# Patient Record
Sex: Male | Born: 1985 | Race: White | Hispanic: No | Marital: Married | Smoking: Current every day smoker
Health system: Southern US, Community
[De-identification: ages and names within clinical notes are randomized; demographics above are authoritative.]

## PROBLEM LIST (undated history)

## (undated) DIAGNOSIS — Z9889 Other specified postprocedural states: Secondary | ICD-10-CM

## (undated) DIAGNOSIS — F419 Anxiety disorder, unspecified: Secondary | ICD-10-CM

## (undated) HISTORY — PX: HERNIA REPAIR: SHX51

## (undated) HISTORY — PX: HAND SURGERY: SHX662

## (undated) HISTORY — PX: APPENDECTOMY: SHX54

## (undated) HISTORY — PX: ROOT CANAL: SHX2363

---
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
## 2004-07-06 ENCOUNTER — Emergency Department (HOSPITAL_COMMUNITY): Admission: EM | Admit: 2004-07-06 | Discharge: 2004-07-07 | Payer: Self-pay | Admitting: *Deleted

## 2005-05-10 ENCOUNTER — Emergency Department (HOSPITAL_COMMUNITY): Admission: EM | Admit: 2005-05-10 | Discharge: 2005-05-10 | Payer: Self-pay | Admitting: *Deleted

## 2006-01-01 ENCOUNTER — Emergency Department (HOSPITAL_COMMUNITY): Admission: EM | Admit: 2006-01-01 | Discharge: 2006-01-01 | Payer: Self-pay | Admitting: Emergency Medicine

## 2007-03-06 ENCOUNTER — Emergency Department (HOSPITAL_COMMUNITY): Admission: EM | Admit: 2007-03-06 | Discharge: 2007-03-06 | Payer: Self-pay | Admitting: Emergency Medicine

## 2009-02-07 ENCOUNTER — Emergency Department: Payer: Self-pay | Admitting: Emergency Medicine

## 2009-10-09 ENCOUNTER — Emergency Department: Payer: Self-pay | Admitting: Emergency Medicine

## 2009-10-23 ENCOUNTER — Emergency Department: Payer: Self-pay | Admitting: Emergency Medicine

## 2010-06-08 ENCOUNTER — Emergency Department (HOSPITAL_COMMUNITY): Admission: EM | Admit: 2010-06-08 | Discharge: 2010-06-08 | Payer: Self-pay | Admitting: Emergency Medicine

## 2010-09-23 LAB — RAPID STREP SCREEN (MED CTR MEBANE ONLY): Streptococcus, Group A Screen (Direct): NEGATIVE
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
## 2010-11-28 NOTE — H&P (Signed)
[facility]  Patient:    Ricardo Juarez, Ricardo Juarez Visit Number: [id number] MRN: [id number]          Service Type: MED Location: 3A A315 01 Attending Physician:  Corlis Leak. Dictated by:   Elpidio Anis, M.D. Admit Date:  12/15/2001   CC:         Lilyan Punt, M.D.   History and Physical  HISTORY OF PRESENT ILLNESS:  A 25 year old male admitted with acute abdominal pain with recurrent episodes of nausea and vomiting.  The patient has a history of acute onset of mid and epigastric abdominal pain about 3 p.m. on the day prior to admission.  He had nausea and vomiting all day.  He came to the emergency room where he was noted to be in severe distress and was noted to have significant epigastric tenderness with guarding and rebound.  He had multiple episodes of nausea and vomiting in the emergency room.  He had a very tender abdomen.  Labs were done and white count was 18,800 with 73 segs and 14 bands.  His potassium was decreased at 2.9.  Liver functions were normal. Amylase and lipase were not done.  Urinalysis was unremarkable.  CT of the abdomen was done, but a lot of contrast was not kept down by the patient.  CT only showed questionable pericholecystic fluid and some soft tissue prominence of the cecum.  The patient reportedly had an appendectomy as a child.  There was no free fluid and no free air in the abdomen.  The patient had an ultrasound earlier this morning which showed mild gallbladder wall thickening without fluid or stones and with a normal common bile duct.  He continues to be symptomatic and is admitted.  We will try to get a HIDA scan, but the contrast may interfere with the HIDA scan.  If the HIDA scan cannot be done, we will treat him symptomatically and probably proceed based on history and radiographic findings with diagnostic laparoscopy and cholecystectomy in the morning.  PAST HISTORY:  He has no major medical illness.  He has a  history of anxiety disorder and panic attacks for which he takes Xanax 0.25 mg t.i.d.  ALLERGIES:  He has an allergy to PENICILLIN.  SURGERY:  Right inguinal hernia repair and possible appendectomy.  The mother states that during the time of the right inguinal hernia repair a routine appendix was done; this was usually not done, but it is possible. Nevertheless, he does not have any right lower quadrant tenderness.  MEDICATIONS:  He is on no other medications.  SOCIAL HISTORY:  He is having problems with his family and he is presently not staying at home.  He smokes up to two packs of cigarettes per day, but states that he stopped three weeks ago.  He drinks occasionally and he has used marijuana.  He is very vague about the last time that he used any of these. He denies any cocaine, crack or heroin or methamphetamine use.  FAMILY HISTORY:  Positive for anxiety disorder and panic attacks, diabetes mellitus and hyperlipidemia.  PHYSICAL EXAMINATION:  GENERAL:  He is a very thin male in moderate distress with multiple episodes of retching with nausea and vomiting.  VITAL SIGNS:  Blood pressure 139/82, pulse 52, respiration 16, temperature 97.2.  HEENT:  Unremarkable.  There is no evidence of clinical jaundice.  NECK:  Supple.  There are a few nodes posteriorly and in the submental region which are nontender.  CHEST:  Reveals coarse tubular breath sounds, more prominent on the right than on the left.  HEART:  Regular rate and rhythm without murmur, gallop or rub.  ABDOMEN:  Nondistended, moderate to moderately severe epigastric and right upper quadrant tenderness, normal bowel sounds, no lower abdominal tenderness. He has good active bowel sounds.  EXTREMITIES:  Unremarkable.  NEUROLOGIC EXAM:  No motor, sensory or cerebellar deficits.  IMPRESSION: 1. Recurrent abdominal pain with nausea and vomiting with epigastric and right    upper quadrant discomfort associated with  leukocytosis.  The history    suggests gallbladder disease, although it has not been definitively    diagnosed on ultrasound or computed tomography scan. 2. History of panic disorder. 3. Psychosocial disorder.  PLAN:  The patient will be admitted, made n.p.o.  Will check his amylase and lipase, do a urine drug screen and check a lipid panel since there is a family history of hyperlipidemia.  If the HIDA cannot be done, will treat him overnight and rehydrate and replace his potassium and, if he remains symptomatic, will proceed with diagnostic laparoscopy and possible cholecystectomy in the morning.  This has been discussed with the patient. Dictated by:   Elpidio Anis, M.D. Attending Physician:  Corlis Leak DD:  12/15/01 TD:  12/15/01 Job: [id number]

## 2010-11-28 NOTE — Discharge Summary (Signed)
NAME:  Ricardo Juarez, Ricardo Juarez                        ACCOUNT NO.:  [id number]   MEDICAL RECORD NO.:  [id number]                   PATIENT TYPE:  NP   LOCATION:  A315                                 FACILITY:  [facility]   PHYSICIAN:  Jerolyn Shin C. Katrinka Blazing, M.D.                DATE OF BIRTH:  1985/09/04   DATE OF ADMISSION:  12/15/2001  DATE OF DISCHARGE:  12/21/2001                                 DISCHARGE SUMMARY   DISCHARGE DIAGNOSES:  1. Acute appendicitis with perforation and peritonitis.  2. History of panic disorder.  3. Psychosocial disorder.   PROCEDURE:  Diagnostic laparoscopy with appendectomy on December 16, 2001.   DISPOSITION:  The patient is discharged to home in stable, satisfactory  condition.   DISCHARGE MEDICATIONS:  1. Levaquin 500 mg q.d. x5 days.  2. Doxycycline 100 mg b.i.d. x5 days.  3. Darvocet-N 100 one to two every four hours as needed.   FOLLOW UP:  The patient will be seen in the office in one week.   HISTORY OF PRESENT ILLNESS:  This is a 25 year old male admitted with acute  abdominal pain with recurrent episodes of nausea and vomiting.  He had a  history of acute onset of mid and epigastric abdominal pain about 3 p.m. on  the day prior to admission.  He had nausea and vomiting all day.  He was  seen in the emergency room and noted to be in severe distress and was noted  to have epigastric tenderness with guarding and rebound.  Labs were done  that revealed a white count of 18,800 with 73 segs and 14 bands.  Potassium  was decreased to 2.9.  CT of the abdomen was done, but not a lot of contrast  was kept down, so it only showed questionable pericholecystic fluid and soft  tissue prominence of the cecum.  The patient had a history of questionable  appendectomy as a child.  He had an ultrasound which showed gallbladder wall  thickening without stones.  The patient was admitted for further diagnostic  studies.   PAST MEDICAL HISTORY:  Anxiety disorder with panic  attacks.   MEDICATIONS:  Xanax 0.25 mg t.i.d.   PAST SURGICAL HISTORY:  The mother relates he had a right inguinal hernia  repair as a child and that appendectomy was done during that time.   SOCIAL HISTORY:  He smokes two packs of cigarettes per day.  He drinks and  also uses marijuana.  He denied cocaine, crack, heroin or methamphetamine  use.   PHYSICAL EXAMINATION:  VITAL SIGNS:  Afebrile.  ABDOMEN:  Moderately to severely tender in the epigastric and right upper  quadrant.   HOSPITAL COURSE:  The patient was admitted and made NPO.  HIDA scan was done  showing ejection fraction of 17%.  It was felt that he had also some mid  epigastric and abdominal tenderness.  It was decided to proceed  with  diagnostic laparoscopy.  On December 16, 2001, diagnostic laparoscopy was done.  At the time of laparoscopy, he was found to have diffuse peritonitis with a  ruptured appendix anterior to the cecum in the mid abdomen.  He had  extensive peritoneal fluid surrounding the liver and gallbladder.  The  gallbladder was inflamed.  A laparoscopic appendectomy was done.  His  gallbladder was not removed.  The patient had some temperature elevation in  the early postoperative period.  White count initially was 18,000, but then  gradually decreased.  He felt better with each passing day.  He developed  multiple bowel movements on June 9, and after that his appetite improved.  He remained stable and was discharged home on December 21, 2001, in stable and  satisfactory condition.  Drug screen was performed and was negative for  amphetamines, cocaine, opiates, barbiturates or benzodiazepines.  The  patient was discharged to home in satisfactory condition.                                               Dirk Dress. Katrinka Blazing, M.D.    LCS/MEDQ  D:  02/19/2002  T:  02/24/2002  Job:  [id number]

## 2010-11-28 NOTE — Op Note (Signed)
   NAME:  Ricardo Juarez, Ricardo Juarez                        ACCOUNT NO.:  [id number]   MEDICAL RECORD NO.:  [id number]                   PATIENT TYPE:  NP   LOCATION:  A315                                 FACILITY:  [facility]   PHYSICIAN:  Jerolyn Shin C. Katrinka Blazing, M.D.                DATE OF BIRTH:  07-10-1986   DATE OF PROCEDURE:  12/16/2001  DATE OF DISCHARGE:  12/21/2001                                 OPERATIVE REPORT   PREOPERATIVE DIAGNOSES:  Acute abdomen, possible cholecystitis.   POSTOPERATIVE DIAGNOSES:  Acute appendicitis with perforation and  peritonitis.   PROCEDURE:  Diagnostic laparoscopy with appendectomy.   SURGEON:  Dr. Elpidio Anis.   DESCRIPTION OF PROCEDURE:  Under general anesthesia, the patient's abdomen  was prepped and draped in a sterile field. A supraumbilical incision was  made and Veress needle was inserted uneventfully. The abdomen was  insufflated with 3 liters of CO2. Using a Visiport guide, a 10 mm port was  placed. Upon placing the camera, it was noted that the patient had diffuse  purulent peritoneal fluid with a ruptured appendicitis lying anterior to the  cecum in the mid abdomen. There was extensive peritoneal fluid surrounding  the liver and gallbladder. There was a secondary gallbladder serositis due  to peritonitis. It was elected not to remove the gallbladder since this was  not the primary source of inflammation. The 5 mm port was placed under  videoscopic guidance and a 12 mm port was placed in the left lower quadrant  under videoscopic guidance. Copious irrigation was carried out without  difficulty. The appendix was grasped with a noncrushing grasper and the  mesoappendix was dissected and clipped serially with large hemoclips and  divided. The base of the appendix was dissected and was then transected  using an EndoGIA stapler using vascular type staples. The appendix was  placed in an EndoCatch device and retrieved uneventfully. Further irrigation  was  carried out. A JP drain was placed in the right gutter and brought out  through the lower midline incision. Further inspection revealed the  gallbladder to be secondarily inflamed but otherwise looked normal so it was  not addressed. The CO2 was allowed to escape from the abdomen and the ports  were removed. The incisions were closed using 0 Dexon on the fascia of the  larger incisions and staples on the skin. The patient was awakened from  anesthesia uneventfully and transferred to a bed and taken to the post  anesthesia care unit.                                               Dirk Dress. Katrinka Blazing, M.D.    LCS/MEDQ  D:  02/19/2002  T:  02/22/2002  Job:  [id number]

## 2011-09-14 ENCOUNTER — Emergency Department: Payer: Self-pay | Admitting: *Deleted

## 2012-05-23 ENCOUNTER — Encounter (HOSPITAL_COMMUNITY): Payer: Self-pay | Admitting: *Deleted

## 2012-05-23 ENCOUNTER — Emergency Department (HOSPITAL_COMMUNITY)
Admission: EM | Admit: 2012-05-23 | Discharge: 2012-05-23 | Discharge status: Against Medical Advice | Payer: Self-pay | Attending: Emergency Medicine | Admitting: Emergency Medicine

## 2012-05-23 DIAGNOSIS — F17200 Nicotine dependence, unspecified, uncomplicated: Secondary | ICD-10-CM | POA: Insufficient documentation

## 2012-05-23 DIAGNOSIS — S0003XA Contusion of scalp, initial encounter: Secondary | ICD-10-CM | POA: Insufficient documentation

## 2012-05-23 NOTE — ED Notes (Signed)
Third attempt for to call for room placement.  No response.  Pt not in waiting area.
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
## 2012-05-23 NOTE — ED Notes (Signed)
Attempted to call pt for room placement.  No response.  

## 2012-05-23 NOTE — ED Notes (Signed)
Second attempt to place pt in room.  No response.

## 2012-06-19 ENCOUNTER — Emergency Department (HOSPITAL_COMMUNITY)
Admission: EM | Admit: 2012-06-19 | Discharge: 2012-06-19 | Disposition: A | Discharge status: Home / Self Care | Payer: Self-pay | Attending: Emergency Medicine | Admitting: Emergency Medicine

## 2012-06-19 ENCOUNTER — Encounter (HOSPITAL_COMMUNITY): Payer: Self-pay | Admitting: *Deleted

## 2012-06-19 DIAGNOSIS — K0389 Other specified diseases of hard tissues of teeth: Secondary | ICD-10-CM | POA: Insufficient documentation

## 2012-06-19 DIAGNOSIS — H9209 Otalgia, unspecified ear: Secondary | ICD-10-CM | POA: Insufficient documentation

## 2012-06-19 DIAGNOSIS — K0889 Other specified disorders of teeth and supporting structures: Secondary | ICD-10-CM

## 2012-06-19 DIAGNOSIS — R51 Headache: Secondary | ICD-10-CM | POA: Insufficient documentation

## 2012-06-19 DIAGNOSIS — F17200 Nicotine dependence, unspecified, uncomplicated: Secondary | ICD-10-CM | POA: Insufficient documentation

## 2012-06-19 DIAGNOSIS — H9319 Tinnitus, unspecified ear: Secondary | ICD-10-CM | POA: Insufficient documentation

## 2012-06-19 DIAGNOSIS — Z9889 Other specified postprocedural states: Secondary | ICD-10-CM | POA: Insufficient documentation

## 2012-06-19 DIAGNOSIS — R6884 Jaw pain: Secondary | ICD-10-CM | POA: Insufficient documentation

## 2012-06-19 DIAGNOSIS — K089 Disorder of teeth and supporting structures, unspecified: Secondary | ICD-10-CM | POA: Insufficient documentation

## 2012-06-19 DIAGNOSIS — R220 Localized swelling, mass and lump, head: Secondary | ICD-10-CM | POA: Insufficient documentation

## 2012-06-19 MED ORDER — ONDANSETRON HCL 4 MG PO TABS
4.0000 mg | ORAL_TABLET | Freq: Once | ORAL | Status: AC
  Administered 2012-06-19: 4 mg via ORAL
  Filled 2012-06-19: qty 1

## 2012-06-19 MED ORDER — CIPROFLOXACIN HCL 250 MG PO TABS
500.0000 mg | ORAL_TABLET | Freq: Once | ORAL | Status: AC
  Administered 2012-06-19: 500 mg via ORAL
  Filled 2012-06-19: qty 2

## 2012-06-19 MED ORDER — CIPROFLOXACIN HCL 500 MG PO TABS: 500.0000 mg | ORAL_TABLET | Freq: Two times a day (BID) | ORAL | Status: DC

## 2012-06-19 MED ORDER — HYDROCODONE-ACETAMINOPHEN 5-325 MG PO TABS
2.0000 | ORAL_TABLET | Freq: Once | ORAL | Status: AC
  Administered 2012-06-19: 2 via ORAL
  Filled 2012-06-19: qty 2

## 2012-06-19 MED ORDER — MELOXICAM 7.5 MG PO TABS: ORAL_TABLET | ORAL | Status: DC

## 2012-06-19 MED ORDER — HYDROCODONE-ACETAMINOPHEN 5-325 MG PO TABS: ORAL_TABLET | ORAL | Status: DC

## 2012-06-19 MED ORDER — KETOROLAC TROMETHAMINE 10 MG PO TABS
10.0000 mg | ORAL_TABLET | Freq: Once | ORAL | Status: AC
  Administered 2012-06-19: 10 mg via ORAL
  Filled 2012-06-19: qty 1

## 2012-06-19 NOTE — ED Provider Notes (Signed)
History     CSN: [id number]  Arrival date & time 06/19/12  2206   None     Chief Complaint  Patient presents with  . Dental Pain  . Tinnitus    (Consider location/radiation/quality/duration/timing/severity/associated sxs/prior treatment) Patient is a 26 y.o. male presenting with tooth pain. The history is provided by the patient.  Dental PainThe primary symptoms include mouth pain and headaches. Primary symptoms do not include shortness of breath or cough. Primary symptoms comment: left ear ringing The symptoms began 3 to 5 days ago. The symptoms are worsening. The symptoms are chronic. The symptoms occur constantly.  Mouth pain began 3 - 5 days ago. Mouth pain occurs frequently. Mouth pain is worsening. Affected locations include: teeth. The mouth pain is currently at 10/10.  The headache is not associated with photophobia.  Additional symptoms include: dental sensitivity to temperature, gum swelling, jaw pain and ear pain. Additional symptoms do not include: nosebleeds. Medical issues include: smoking.    History reviewed. No pertinent past medical history.  Past Surgical History  Procedure Date  . Appendectomy   . Hernia repair     History reviewed. No pertinent family history.  History  Substance Use Topics  . Smoking status: Current Every Day Smoker -- 1.5 packs/day    Types: Cigarettes  . Smokeless tobacco: Not on file  . Alcohol Use: No      Review of Systems  Constitutional: Negative for activity change.       All ROS Neg except as noted in HPI  HENT: Positive for ear pain. Negative for nosebleeds and neck pain.   Eyes: Negative for photophobia and discharge.  Respiratory: Negative for cough, shortness of breath and wheezing.   Cardiovascular: Negative for chest pain and palpitations.  Gastrointestinal: Negative for abdominal pain and blood in stool.  Genitourinary: Negative for dysuria, frequency and hematuria.  Musculoskeletal: Negative for back pain and  arthralgias.  Skin: Negative.   Neurological: Positive for headaches. Negative for dizziness, seizures and speech difficulty.  Psychiatric/Behavioral: Negative for hallucinations and confusion.    Allergies  Amoxicillin and Penicillins  Home Medications   Current Outpatient Rx  Name  Route  Sig  Dispense  Refill  . IBUPROFEN 200 MG PO TABS   Oral   Take 800 mg by mouth every 6 (six) hours as needed. pain           BP 139/72  Pulse 76  Temp 98.1 F (36.7 C) (Axillary)  Resp 18  Ht 5\' 9"  (1.753 m)  Wt 150 lb (68.04 kg)  BMI 22.15 kg/m2  SpO2 99%  Physical Exam  Nursing note and vitals reviewed. Constitutional: He is oriented to person, place, and time. He appears well-developed and well-nourished.  Non-toxic appearance.  HENT:  Head: Normocephalic.  Right Ear: Tympanic membrane and external ear normal.  Left Ear: Tympanic membrane and external ear normal.       Multiple severely decayed teeth. The left upper canine has a deep cavity present with swelling around the tooth. No visible abscess. The airway is patent. No swelling under the tongue.  Eyes: EOM and lids are normal. Pupils are equal, round, and reactive to light.  Neck: Normal range of motion. Neck supple. Carotid bruit is not present.  Cardiovascular: Normal rate, regular rhythm, normal heart sounds, intact distal pulses and normal pulses.   No murmur heard. Pulmonary/Chest: Breath sounds normal. No respiratory distress.  Abdominal: Soft. Bowel sounds are normal. There is no tenderness. There is no  guarding.  Musculoskeletal: Normal range of motion.  Lymphadenopathy:       Head (right side): No submandibular adenopathy present.       Head (left side): No submandibular adenopathy present.    He has no cervical adenopathy.  Neurological: He is alert and oriented to person, place, and time. He has normal strength. No cranial nerve deficit or sensory deficit.  Skin: Skin is warm and dry.  Psychiatric: He has a  normal mood and affect. His speech is normal.    ED Course  Procedures (including critical care time)  Labs Reviewed - No data to display No results found. Pulse Ox 97% on room air. WNL by my interpretation.  No diagnosis found.    MDM  I have reviewed nursing notes, vital signs, and all appropriate lab and imaging results for this patient. Pt has hx of dental problems. The left upper canine area has become more severe in the last 3 to 4 days. Pt has tried OTC meds, but these do not help. No fever, but c/o sensation of ringing in the left ear. Plan for RX for cipro, mobic, and norco given to pt. Pt to see a dentist as soon as possible.       Kathie Dike, PA 06/19/12 2314  Kathie Dike, PA 06/19/12 [phone]

## 2012-06-19 NOTE — ED Notes (Signed)
Pt with pain to left upper tooth pain with left ear ringing off and on, states cold water makes it feel better

## 2012-06-19 NOTE — ED Notes (Signed)
Discharge instructions reviewed with pt, questions answered. Pt verbalized understanding.  

## 2012-06-21 NOTE — ED Provider Notes (Signed)
Medical screening examination/treatment/procedure(s) were performed by non-physician practitioner and as supervising physician I was immediately available for consultation/collaboration.   Shelda Jakes, MD 06/21/12 [phone]

## 2012-07-23 ENCOUNTER — Encounter (HOSPITAL_COMMUNITY): Payer: Self-pay

## 2012-07-23 ENCOUNTER — Emergency Department (HOSPITAL_COMMUNITY)
Admission: EM | Admit: 2012-07-23 | Discharge: 2012-07-23 | Disposition: A | Discharge status: Home / Self Care | Payer: Worker's Compensation | Attending: Emergency Medicine | Admitting: Emergency Medicine

## 2012-07-23 ENCOUNTER — Emergency Department (HOSPITAL_COMMUNITY): Payer: Worker's Compensation

## 2012-07-23 DIAGNOSIS — S86009A Unspecified injury of unspecified Achilles tendon, initial encounter: Secondary | ICD-10-CM

## 2012-07-23 DIAGNOSIS — F17200 Nicotine dependence, unspecified, uncomplicated: Secondary | ICD-10-CM | POA: Insufficient documentation

## 2012-07-23 DIAGNOSIS — Y990 Civilian activity done for income or pay: Secondary | ICD-10-CM | POA: Insufficient documentation

## 2012-07-23 DIAGNOSIS — R209 Unspecified disturbances of skin sensation: Secondary | ICD-10-CM | POA: Insufficient documentation

## 2012-07-23 DIAGNOSIS — Y9389 Activity, other specified: Secondary | ICD-10-CM | POA: Insufficient documentation

## 2012-07-23 DIAGNOSIS — S93499A Sprain of other ligament of unspecified ankle, initial encounter: Secondary | ICD-10-CM | POA: Insufficient documentation

## 2012-07-23 DIAGNOSIS — IMO0002 Reserved for concepts with insufficient information to code with codable children: Secondary | ICD-10-CM | POA: Insufficient documentation

## 2012-07-23 DIAGNOSIS — Y9289 Other specified places as the place of occurrence of the external cause: Secondary | ICD-10-CM | POA: Insufficient documentation

## 2012-07-23 MED ORDER — IBUPROFEN 600 MG PO TABS: 600.0000 mg | ORAL_TABLET | Freq: Four times a day (QID) | ORAL | Status: AC | PRN

## 2012-07-23 MED ORDER — IBUPROFEN 800 MG PO TABS
800.0000 mg | ORAL_TABLET | Freq: Once | ORAL | Status: AC
  Administered 2012-07-23: 800 mg via ORAL
  Filled 2012-07-23: qty 1

## 2012-07-23 NOTE — ED Provider Notes (Signed)
Medical screening examination/treatment/procedure(s) were performed by non-physician practitioner and as supervising physician I was immediately available for consultation/collaboration.  Donnetta Hutching, MD 07/23/12 1118

## 2012-07-23 NOTE — ED Provider Notes (Signed)
History     CSN: [id number]  Arrival date & time 07/23/12  5784   First MD Initiated Contact with Patient 07/23/12 [phone]      Chief Complaint  Patient presents with  . Leg Pain    (Consider location/radiation/quality/duration/timing/severity/associated sxs/prior treatment) HPI Comments: Ricardo Juarez presents with pain and swelling to his left posterior lower leg after being struck by a heavy hose filled reel at work just prior to arrival.  He reports swelling and numbness and tingling at the site of the injury.  He is ambulatory and can flex and extend his ankle although he has increased pain in his calf with this movement.  He denies weakness distal to the injury site.  He has had no treatments for this injury prior to arrival.   The history is provided by the patient.    History reviewed. No pertinent past medical history.  Past Surgical History  Procedure Date  . Appendectomy   . Hernia repair     No family history on file.  History  Substance Use Topics  . Smoking status: Current Every Day Smoker -- 1.5 packs/day    Types: Cigarettes  . Smokeless tobacco: Not on file  . Alcohol Use: No      Review of Systems  Musculoskeletal: Positive for arthralgias. Negative for joint swelling.  Skin: Negative for wound.  Neurological: Negative for weakness and numbness.    Allergies  Amoxicillin and Penicillins  Home Medications   Current Outpatient Rx  Name  Route  Sig  Dispense  Refill  . HYDROCODONE-ACETAMINOPHEN 5-325 MG PO TABS      1 or 2 po q4h prn pain   20 tablet   0   . IBUPROFEN 200 MG PO TABS   Oral   Take 800 mg by mouth every 6 (six) hours as needed. pain         . IBUPROFEN 600 MG PO TABS   Oral   Take 1 tablet (600 mg total) by mouth every 6 (six) hours as needed for pain.   20 tablet   0     BP 134/68  Pulse 64  Temp 98.4 F (36.9 C) (Oral)  Resp 18  Ht 5\' 9"  (1.753 m)  Wt 150 lb (68.04 kg)  BMI 22.15 kg/m2  SpO2  98%  Physical Exam  Constitutional: He appears well-developed and well-nourished.  HENT:  Head: Atraumatic.  Neck: Normal range of motion.  Cardiovascular:       Pulses equal bilaterally  Musculoskeletal: He exhibits edema and tenderness.       Left lower leg: He exhibits tenderness and swelling. He exhibits no deformity.       Tender to palpation with slight erythema and edema proximal Achilles below level of gastroc insertion.  There is no palpable disruption of the Achilles tendon.  Patient can flex and extend his ankle with mild discomfort.  Negative Thompson test.  Dorsalis pedis pulse intact, posterior tibial pulses intact.  Less than 3 second cap refill in toes.  Calf is soft and nontender.  Neurological: He is alert. He has normal strength. He displays normal reflexes. No sensory deficit.       Equal strength  Skin: Skin is warm and dry.  Psychiatric: He has a normal mood and affect.    ED Course  Procedures (including critical care time)  Labs Reviewed - No data to display Dg Tibia/fibula Left  07/23/2012  *RADIOLOGY REPORT*  Clinical Data: Pain and swelling in  the calf.  Trauma.  LEFT TIBIA AND FIBULA - 2 VIEW  Comparison: None.  Findings: Tibia and fibula appear within normal limits.  No fracture.  Prominence of the dorsal proximal leg likely represents prominent gastrocnemius.  Soft tissue swelling is also a consideration.  IMPRESSION: No acute osseous abnormality.   Original Report Authenticated By: Andreas Newport, M.D.      1. Injury of Achilles tendon       MDM  X-rays reviewed, suspect contusion with possible small hematoma surrounding Achilles tendon without tear given patient has continued range of motion and negative Thompson test.  He was encouraged ice and elevation for the next 48 hours, ibuprofen, Ace wrap for mild compression.  Recheck by PCP if not improved over the next 4-5 days.        Burgess Amor, PA 07/23/12 1028  Burgess Amor, [place] 07/23/12 1029

## 2012-07-23 NOTE — ED Notes (Signed)
Pt reports was at work and was pulling a reel, pt says he slipped and the reel hit the back of his leg.  Pt says lower leg is numb and tingly.

## 2013-07-03 ENCOUNTER — Emergency Department (HOSPITAL_COMMUNITY)
Admission: EM | Admit: 2013-07-03 | Discharge: 2013-07-03 | Disposition: A | Discharge status: Home / Self Care | Payer: Self-pay | Attending: Emergency Medicine | Admitting: Emergency Medicine

## 2013-07-03 ENCOUNTER — Emergency Department (HOSPITAL_COMMUNITY): Payer: Self-pay

## 2013-07-03 ENCOUNTER — Encounter (HOSPITAL_COMMUNITY): Payer: Self-pay | Admitting: Emergency Medicine

## 2013-07-03 DIAGNOSIS — R42 Dizziness and giddiness: Secondary | ICD-10-CM | POA: Insufficient documentation

## 2013-07-03 DIAGNOSIS — Z880 Allergy status to penicillin: Secondary | ICD-10-CM | POA: Insufficient documentation

## 2013-07-03 DIAGNOSIS — R509 Fever, unspecified: Secondary | ICD-10-CM | POA: Insufficient documentation

## 2013-07-03 DIAGNOSIS — J069 Acute upper respiratory infection, unspecified: Secondary | ICD-10-CM | POA: Insufficient documentation

## 2013-07-03 DIAGNOSIS — F17200 Nicotine dependence, unspecified, uncomplicated: Secondary | ICD-10-CM | POA: Insufficient documentation

## 2013-07-03 MED ORDER — PROMETHAZINE HCL 25 MG PO TABS: 25.0000 mg | ORAL_TABLET | Freq: Four times a day (QID) | ORAL | Status: DC | PRN

## 2013-07-03 MED ORDER — HYDROCODONE-HOMATROPINE 5-1.5 MG/5ML PO SYRP: 5.0000 mL | ORAL_SOLUTION | Freq: Four times a day (QID) | ORAL | Status: DC | PRN

## 2013-07-03 MED ORDER — IBUPROFEN 800 MG PO TABS: 800.0000 mg | ORAL_TABLET | Freq: Three times a day (TID) | ORAL | Status: DC

## 2013-07-03 NOTE — ED Provider Notes (Signed)
CSN: [id number]     Arrival date & time 07/03/13  1846 History   First MD Initiated Contact with Patient 07/03/13 1850     Chief Complaint  Patient presents with  . Fever   (Consider location/radiation/quality/duration/timing/severity/associated sxs/prior Treatment) HPI Comments: Patient presents to the ER for evaluation of fever with cough and chest congestion. Patient reports the symptoms began this afternoon. He went home after work and lay down, when he tried to get up he started feeling more lightheaded. He has had nausea but no vomiting or diarrhea. Patient felt slightly short of breath earlier, this is improved. No abdominal pain.  Patient is a 27 y.o. male presenting with fever.  Fever Associated symptoms: cough     History reviewed. No pertinent past medical history. Past Surgical History  Procedure Laterality Date  . Appendectomy    . Hernia repair     No family history on file. History  Substance Use Topics  . Smoking status: Current Every Day Smoker -- 1.50 packs/day    Types: Cigarettes  . Smokeless tobacco: Not on file  . Alcohol Use: No    Review of Systems  Constitutional: Positive for fever.  Respiratory: Positive for cough.   Neurological: Positive for dizziness.  All other systems reviewed and are negative.    Allergies  Amoxicillin and Penicillins  Home Medications   Current Outpatient Rx  Name  Route  Sig  Dispense  Refill  . HYDROcodone-acetaminophen (NORCO) 5-325 MG per tablet      1 or 2 po q4h prn pain   20 tablet   0   . ibuprofen (ADVIL,MOTRIN) 200 MG tablet   Oral   Take 800 mg by mouth every 6 (six) hours as needed. pain          BP 136/80  Pulse 97  Temp(Src) 102.3 F (39.1 C) (Oral)  Resp 24  Ht 5\' 8"  (1.727 m)  Wt 150 lb (68.04 kg)  BMI 22.81 kg/m2  SpO2 97% Physical Exam  Constitutional: He is oriented to person, place, and time. He appears well-developed and well-nourished. No distress.  HENT:  Head:  Normocephalic and atraumatic.  Right Ear: Hearing normal.  Left Ear: Hearing normal.  Nose: Nose normal.  Mouth/Throat: Oropharynx is clear and moist and mucous membranes are normal.  Eyes: Conjunctivae and EOM are normal. Pupils are equal, round, and reactive to light.  Neck: Normal range of motion. Neck supple.  Cardiovascular: Regular rhythm, S1 normal and S2 normal.  Exam reveals no gallop and no friction rub.   No murmur heard. Pulmonary/Chest: Effort normal and breath sounds normal. No respiratory distress. He exhibits no tenderness.  Abdominal: Soft. Normal appearance and bowel sounds are normal. There is no hepatosplenomegaly. There is no tenderness. There is no rebound, no guarding, no tenderness at McBurney's point and negative Murphy's sign. No hernia.  Musculoskeletal: Normal range of motion.  Neurological: He is alert and oriented to person, place, and time. He has normal strength. No cranial nerve deficit or sensory deficit. Coordination normal. GCS eye subscore is 4. GCS verbal subscore is 5. GCS motor subscore is 6.  Skin: Skin is warm, dry and intact. No rash noted. No cyanosis.  Psychiatric: He has a normal mood and affect. His speech is normal and behavior is normal. Thought content normal.    ED Course  Procedures (including critical care time) Labs Review Labs Reviewed - No data to display Imaging Review Dg Chest 2 View  07/03/2013   CLINICAL  DATA:  Fever and cough.  EXAM: CHEST  2 VIEW  COMPARISON:  None.  FINDINGS: The lungs are clear without focal infiltrate, edema, pneumothorax or pleural effusion. The cardiopericardial silhouette is within normal limits for size. Imaged bony structures of the thorax are intact.  IMPRESSION: No acute cardiopulmonary findings.   Electronically Signed   By: Kennith Center M.D.   On: 07/03/2013 19:28    EKG Interpretation   None       MDM  Diagnosis: Fever, upper respiratory infection  Patient presents to the ER for evaluation  of fever, chills, upper respiratory infection symptoms including cough. He had dizziness and felt lightheaded, but neurologic examination was unremarkable. No focal abnormalities. Lungs are clear and chest x-ray is clear. Symptoms consistent with viral etiology, possible flu.    Gilda Crease, MD 07/03/13 [phone]

## 2013-07-03 NOTE — ED Notes (Signed)
Pt c/o fever, cough, nausea, chest pain with coughing, and feeling light headed.

## 2013-07-03 NOTE — ED Notes (Signed)
Patient given discharge instruction, verbalized understand. Patient ambulatory out of the department.  

## 2014-03-16 ENCOUNTER — Emergency Department (HOSPITAL_COMMUNITY)
Admission: EM | Admit: 2014-03-16 | Discharge: 2014-03-17 | Disposition: A | Discharge status: Home / Self Care | Payer: Self-pay | Attending: Emergency Medicine | Admitting: Emergency Medicine

## 2014-03-16 ENCOUNTER — Encounter (HOSPITAL_COMMUNITY): Payer: Self-pay | Admitting: Emergency Medicine

## 2014-03-16 DIAGNOSIS — F17200 Nicotine dependence, unspecified, uncomplicated: Secondary | ICD-10-CM | POA: Insufficient documentation

## 2014-03-16 DIAGNOSIS — R42 Dizziness and giddiness: Secondary | ICD-10-CM | POA: Insufficient documentation

## 2014-03-16 DIAGNOSIS — Z791 Long term (current) use of non-steroidal anti-inflammatories (NSAID): Secondary | ICD-10-CM | POA: Insufficient documentation

## 2014-03-16 DIAGNOSIS — Z880 Allergy status to penicillin: Secondary | ICD-10-CM | POA: Insufficient documentation

## 2014-03-16 MED ORDER — SODIUM CHLORIDE 0.9 % IV SOLN
1000.0000 mL | Freq: Once | INTRAVENOUS | Status: AC
  Administered 2014-03-17: 1000 mL via INTRAVENOUS

## 2014-03-16 MED ORDER — ONDANSETRON HCL 4 MG/2ML IJ SOLN
4.0000 mg | Freq: Once | INTRAMUSCULAR | Status: AC
  Administered 2014-03-17: 4 mg via INTRAVENOUS
  Filled 2014-03-16: qty 2

## 2014-03-16 MED ORDER — SODIUM CHLORIDE 0.9 % IV SOLN: 1000.0000 mL | INTRAVENOUS | Status: DC

## 2014-03-16 NOTE — ED Notes (Signed)
Dr. Glick at bedside.  

## 2014-03-16 NOTE — ED Provider Notes (Signed)
CSN: [id number]     Arrival date & time 03/16/14  2249 History   First MD Initiated Contact with Patient 03/16/14 2354     Chief Complaint  Patient presents with  . Dizziness     (Consider location/radiation/quality/duration/timing/severity/associated sxs/prior Treatment) Patient is a 28 y.o. male presenting with dizziness. The history is provided by the patient.  Dizziness Starting 2 days ago, patient is noted to lightheadedness when he gets home. He has been sweaty hands has had some times of disorientation. There's been nausea but no vomiting. He has had a cough productive of some clear to yellow sputum. He states he feels fine during the day even though he is working out in the sun. He has not been drinking as much as he normally would be that he states he's been urinating normal amounts. Denies headache. I has not checked his temperature but his wife states that he did feel hot and he has been very sweaty at home. He denies arthralgias or myalgias. He denies vertigo.  History reviewed. No pertinent past medical history. Past Surgical History  Procedure Laterality Date  . Appendectomy    . Hernia repair     History reviewed. No pertinent family history. History  Substance Use Topics  . Smoking status: Current Every Day Smoker -- 1.50 packs/day    Types: Cigarettes  . Smokeless tobacco: Not on file  . Alcohol Use: No    Review of Systems  Neurological: Positive for dizziness.  All other systems reviewed and are negative.     Allergies  Amoxicillin and Penicillins  Home Medications   Prior to Admission medications   Medication Sig Start Date End Date Taking? Authorizing Provider  HYDROcodone-acetaminophen [facility]) 5-325 MG per tablet 1 or 2 po q4h prn pain 06/19/12   Kathie Dike, PA-C  HYDROcodone-homatropine [facility]) 5-1.5 MG/5ML syrup Take 5 mLs by mouth every 6 (six) hours as needed for cough. 07/03/13   Gilda Crease, MD  ibuprofen (ADVIL,MOTRIN) 200 MG  tablet Take 800 mg by mouth every 6 (six) hours as needed. pain    Historical Provider, MD  ibuprofen (ADVIL,MOTRIN) 800 MG tablet Take 1 tablet (800 mg total) by mouth 3 (three) times daily. 07/03/13   Gilda Crease, MD  promethazine (PHENERGAN) 25 MG tablet Take 1 tablet (25 mg total) by mouth every 6 (six) hours as needed for nausea or vomiting. 07/03/13   Gilda Crease, MD   BP 117/79  Pulse 53  Temp(Src) 98.2 F (36.8 C) (Oral)  Resp 16  Ht  (1.727 m)  Wt 150 lb (68.04 kg)  BMI 22.81 kg/m2  SpO2 98% Physical Exam  Nursing note and vitals reviewed.  28 year old male, resting comfortably and in no acute distress. Vital signs are significant for bradycardia. Oxygen saturation is 98%, which is normal. Head is normocephalic and atraumatic. PERRLA, EOMI. Oropharynx is clear. Neck is nontender and supple without adenopathy or JVD. There are no carotid bruits. Back is nontender and there is no CVA tenderness. Lungs are clear without rales, wheezes, or rhonchi. Chest is nontender. Heart has regular rate and rhythm without murmur. Abdomen is soft, flat, nontender without masses or hepatosplenomegaly and peristalsis is normoactive. Extremities have no cyanosis or edema, full range of motion is present. Skin is warm and dry without rash. Neurologic: Mental status is normal, cranial nerves are intact, there are no motor or sensory deficits. There is no dizziness elicited with head movement.  ED Course  Procedures (  including critical care time) Labs Review Results for orders placed during the hospital encounter of 03/16/14  COMPREHENSIVE METABOLIC PANEL      Result Value Ref Range   Sodium 139  137 - 147 mEq/L   Potassium 3.4 (*) 3.7 - 5.3 mEq/L   Chloride 100  96 - 112 mEq/L   CO2 26  19 - 32 mEq/L   Glucose, Bld 85  70 - 99 mg/dL   BUN 16  6 - 23 mg/dL   Creatinine, Ser 1.61  0.50 - 1.35 mg/dL   Calcium 9.0  8.4 - 09.6 mg/dL   Total Protein 6.7  6.0 - 8.3  g/dL   Albumin 4.0  3.5 - 5.2 g/dL   AST 19  0 - 37 U/L   ALT 10  0 - 53 U/L   Alkaline Phosphatase 62  39 - 117 U/L   Total Bilirubin 0.4  0.3 - 1.2 mg/dL   GFR calc non Af Amer 87 (*) >90 mL/min   GFR calc Af Amer >90  >90 mL/min   Anion gap 13  5 - 15  CBC WITH DIFFERENTIAL      Result Value Ref Range   WBC 8.7  4.0 - 10.5 K/uL   RBC 5.27  4.22 - 5.81 MIL/uL   Hemoglobin 16.7  13.0 - 17.0 g/dL   HCT 04.5  40.9 - 81.1 %   MCV 87.3  78.0 - 100.0 fL   MCH 31.7  26.0 - 34.0 pg   MCHC 36.3 (*) 30.0 - 36.0 g/dL   RDW 91.4  78.2 - 95.6 %   Platelets 229  150 - 400 K/uL   Neutrophils Relative % 46  43 - 77 %   Neutro Abs 4.1  1.7 - 7.7 K/uL   Lymphocytes Relative 40  12 - 46 %   Lymphs Abs 3.4  0.7 - 4.0 K/uL   Monocytes Relative 9  3 - 12 %   Monocytes Absolute 0.8  0.1 - 1.0 K/uL   Eosinophils Relative 4  0 - 5 %   Eosinophils Absolute 0.3  0.0 - 0.7 K/uL   Basophils Relative 1  0 - 1 %   Basophils Absolute 0.1  0.0 - 0.1 K/uL  URINALYSIS, ROUTINE W REFLEX MICROSCOPIC      Result Value Ref Range   Color, Urine YELLOW  YELLOW   APPearance CLEAR  CLEAR   Specific Gravity, Urine 1.020  1.005 - 1.030   pH 6.0  5.0 - 8.0   Glucose, UA NEGATIVE  NEGATIVE mg/dL   Hgb urine dipstick NEGATIVE  NEGATIVE   Bilirubin Urine NEGATIVE  NEGATIVE   Ketones, ur NEGATIVE  NEGATIVE mg/dL   Protein, ur NEGATIVE  NEGATIVE mg/dL   Urobilinogen, UA 0.2  0.0 - 1.0 mg/dL   Nitrite NEGATIVE  NEGATIVE   Leukocytes, UA NEGATIVE  NEGATIVE   Imaging Review Dg Chest 2 View  03/17/2014   CLINICAL DATA:  Dizziness and weakness for 2 weeks.  EXAM: CHEST  2 VIEW  COMPARISON:  07/03/2013  FINDINGS: Mild hyperinflation. The heart size and mediastinal contours are within normal limits. Both lungs are clear. The visualized skeletal structures are unremarkable.  IMPRESSION: No active cardiopulmonary disease.   Electronically Signed   By: Burman Nieves M.D.   On: 03/17/2014 00:49      MDM   Final  diagnoses:  Dizziness    Dizziness, disorientation, and sweating. This may represent an occult infection. No evidence of  labyrinthine disturbance. However, he may just be dehydrated from working in the heat. He'll be given IV fluids orthostatic vital signs will be checked. Screening labs are obtained as well as chest x-ray.  Laboratory workup is unremarkable. Orthostatic vital signs are unremarkable but were not obtained until after he had received his IV fluid bolus. I suspect that he actually was mildly dehydrated. He states he feels better following IV hydration. He is discharged with instructions to return to drink plenty of fluids-especially when working outside in the heat and humidity.  Dione Booze, MD 03/17/14 [phone]

## 2014-03-16 NOTE — ED Notes (Signed)
Onset 2 days ago working in hot sun,  Felt light headed and tightness  In neck and chest and has been intermittent since then

## 2014-03-17 ENCOUNTER — Emergency Department (HOSPITAL_COMMUNITY): Payer: Self-pay

## 2014-03-17 LAB — URINALYSIS, ROUTINE W REFLEX MICROSCOPIC
BILIRUBIN URINE: NEGATIVE
Glucose, UA: NEGATIVE mg/dL
HGB URINE DIPSTICK: NEGATIVE
Ketones, ur: NEGATIVE mg/dL
Leukocytes, UA: NEGATIVE
NITRITE: NEGATIVE
PH: 6 (ref 5.0–8.0)
Protein, ur: NEGATIVE mg/dL
SPECIFIC GRAVITY, URINE: 1.02 (ref 1.005–1.030)
UROBILINOGEN UA: 0.2 mg/dL (ref 0.0–1.0)

## 2014-03-17 LAB — CBC WITH DIFFERENTIAL/PLATELET
BASOS ABS: 0.1 10*3/uL (ref 0.0–0.1)
Basophils Relative: 1 % (ref 0–1)
Eosinophils Absolute: 0.3 10*3/uL (ref 0.0–0.7)
Eosinophils Relative: 4 % (ref 0–5)
HEMATOCRIT: 46 % (ref 39.0–52.0)
HEMOGLOBIN: 16.7 g/dL (ref 13.0–17.0)
LYMPHS PCT: 40 % (ref 12–46)
Lymphs Abs: 3.4 10*3/uL (ref 0.7–4.0)
MCH: 31.7 pg (ref 26.0–34.0)
MCHC: 36.3 g/dL — ABNORMAL HIGH (ref 30.0–36.0)
MCV: 87.3 fL (ref 78.0–100.0)
MONO ABS: 0.8 10*3/uL (ref 0.1–1.0)
MONOS PCT: 9 % (ref 3–12)
NEUTROS ABS: 4.1 10*3/uL (ref 1.7–7.7)
NEUTROS PCT: 46 % (ref 43–77)
Platelets: 229 10*3/uL (ref 150–400)
RBC: 5.27 MIL/uL (ref 4.22–5.81)
RDW: 12.4 % (ref 11.5–15.5)
WBC: 8.7 10*3/uL (ref 4.0–10.5)

## 2014-03-17 LAB — COMPREHENSIVE METABOLIC PANEL
ALBUMIN: 4 g/dL (ref 3.5–5.2)
ALT: 10 U/L (ref 0–53)
ANION GAP: 13 (ref 5–15)
AST: 19 U/L (ref 0–37)
Alkaline Phosphatase: 62 U/L (ref 39–117)
BUN: 16 mg/dL (ref 6–23)
CALCIUM: 9 mg/dL (ref 8.4–10.5)
CO2: 26 mEq/L (ref 19–32)
CREATININE: 1.12 mg/dL (ref 0.50–1.35)
Chloride: 100 mEq/L (ref 96–112)
GFR calc non Af Amer: 87 mL/min — ABNORMAL LOW (ref >90)
Glucose, Bld: 85 mg/dL (ref 70–99)
Potassium: 3.4 mEq/L — ABNORMAL LOW (ref 3.7–5.3)
Sodium: 139 mEq/L (ref 137–147)
TOTAL PROTEIN: 6.7 g/dL (ref 6.0–8.3)
Total Bilirubin: 0.4 mg/dL (ref 0.3–1.2)

## 2014-03-17 NOTE — Discharge Instructions (Signed)
Make sure to drink plenty of fluids - especially when you are out in teh heat and humidity.   Dizziness Dizziness is a common problem. It is a feeling of unsteadiness or light-headedness. You may feel like you are about to faint. Dizziness can lead to injury if you stumble or fall. A person of any age group can suffer from dizziness, but dizziness is more common in older adults. CAUSES  Dizziness can be caused by many different things, including:  Middle ear problems.  Standing for too long.  Infections.  An allergic reaction.  Aging.  An emotional response to something, such as the sight of blood.  Side effects of medicines.  Tiredness.  Problems with circulation or blood pressure.  Excessive use of alcohol or medicines, or illegal drug use.  Breathing too fast (hyperventilation).  An irregular heart rhythm (arrhythmia).  A low red blood cell count (anemia).  Pregnancy.  Vomiting, diarrhea, fever, or other illnesses that cause body fluid loss (dehydration).  Diseases or conditions such as Parkinson's disease, high blood pressure (hypertension), diabetes, and thyroid problems.  Exposure to extreme heat. DIAGNOSIS  Your health care provider will ask about your symptoms, perform a physical exam, and perform an electrocardiogram (ECG) to record the electrical activity of your heart. Your health care provider may also perform other heart or blood tests to determine the cause of your dizziness. These may include:  Transthoracic echocardiogram (TTE). During echocardiography, sound waves are used to evaluate how blood flows through your heart.  Transesophageal echocardiogram (TEE).  Cardiac monitoring. This allows your health care provider to monitor your heart rate and rhythm in real time.  Holter monitor. This is a portable device that records your heartbeat and can help diagnose heart arrhythmias. It allows your health care provider to track your heart activity for  several days if needed.  Stress tests by exercise or by giving medicine that makes the heart beat faster. TREATMENT  Treatment of dizziness depends on the cause of your symptoms and can vary greatly. HOME CARE INSTRUCTIONS   Drink enough fluids to keep your urine clear or pale yellow. This is especially important in very hot weather. In older adults, it is also important in cold weather.  Take your medicine exactly as directed if your dizziness is caused by medicines. When taking blood pressure medicines, it is especially important to get up slowly.  Rise slowly from chairs and steady yourself until you feel okay.  In the morning, first sit up on the side of the bed. When you feel okay, stand slowly while holding onto something until you know your balance is fine.  Move your legs often if you need to stand in one place for a long time. Tighten and relax your muscles in your legs while standing.  Have someone stay with you for 1-2 days if dizziness continues to be a problem. Do this until you feel you are well enough to stay alone. Have the person call your health care provider if he or she notices changes in you that are concerning.  Do not drive or use heavy machinery if you feel dizzy.  Do not drink alcohol. SEEK IMMEDIATE MEDICAL CARE IF:   Your dizziness or light-headedness gets worse.  You feel nauseous or vomit.  You have problems talking, walking, or using your arms, hands, or legs.  You feel weak.  You are not thinking clearly or you have trouble forming sentences. It may take a friend or family member to notice  this.  You have chest pain, abdominal pain, shortness of breath, or sweating.  Your vision changes.  You notice any bleeding.  You have side effects from medicine that seems to be getting worse rather than better. MAKE SURE YOU:   Understand these instructions.  Will watch your condition.  Will get help right away if you are not doing well or get  worse. Document Released: 12/23/2000 Document Revised: 07/04/2013 Document Reviewed: 01/16/2011 [facility] Patient Information 2015 [place], [place]. This information is not intended to replace advice given to you by your health care provider. Make sure you discuss any questions you have with your health care provider.

## 2014-05-01 ENCOUNTER — Emergency Department (HOSPITAL_COMMUNITY): Payer: Worker's Compensation

## 2014-05-01 ENCOUNTER — Encounter (HOSPITAL_COMMUNITY): Payer: Self-pay | Admitting: Emergency Medicine

## 2014-05-01 ENCOUNTER — Emergency Department (HOSPITAL_COMMUNITY)
Admission: EM | Admit: 2014-05-01 | Discharge: 2014-05-01 | Disposition: A | Discharge status: Home / Self Care | Payer: Worker's Compensation | Attending: Emergency Medicine | Admitting: Emergency Medicine

## 2014-05-01 DIAGNOSIS — L03119 Cellulitis of unspecified part of limb: Secondary | ICD-10-CM

## 2014-05-01 DIAGNOSIS — M79642 Pain in left hand: Secondary | ICD-10-CM | POA: Diagnosis present

## 2014-05-01 DIAGNOSIS — Z720 Tobacco use: Secondary | ICD-10-CM | POA: Diagnosis not present

## 2014-05-01 DIAGNOSIS — L03114 Cellulitis of left upper limb: Secondary | ICD-10-CM | POA: Insufficient documentation

## 2014-05-01 DIAGNOSIS — Z880 Allergy status to penicillin: Secondary | ICD-10-CM | POA: Insufficient documentation

## 2014-05-01 DIAGNOSIS — Z792 Long term (current) use of antibiotics: Secondary | ICD-10-CM | POA: Diagnosis not present

## 2014-05-01 LAB — BASIC METABOLIC PANEL
Anion gap: 12 (ref 5–15)
BUN: 10 mg/dL (ref 6–23)
CHLORIDE: 101 meq/L (ref 96–112)
CO2: 29 meq/L (ref 19–32)
Calcium: 9.4 mg/dL (ref 8.4–10.5)
Creatinine, Ser: 1.29 mg/dL (ref 0.50–1.35)
GFR calc Af Amer: 85 mL/min — ABNORMAL LOW (ref >90)
GFR, EST NON AFRICAN AMERICAN: 74 mL/min — AB (ref >90)
GLUCOSE: 87 mg/dL (ref 70–99)
POTASSIUM: 3.8 meq/L (ref 3.7–5.3)
SODIUM: 142 meq/L (ref 137–147)

## 2014-05-01 LAB — CBC WITH DIFFERENTIAL/PLATELET
Basophils Absolute: 0.1 10*3/uL (ref 0.0–0.1)
Basophils Relative: 1 % (ref 0–1)
EOS ABS: 0.3 10*3/uL (ref 0.0–0.7)
Eosinophils Relative: 2 % (ref 0–5)
HCT: 49.4 % (ref 39.0–52.0)
HEMOGLOBIN: 17.8 g/dL — AB (ref 13.0–17.0)
LYMPHS ABS: 2.3 10*3/uL (ref 0.7–4.0)
LYMPHS PCT: 18 % (ref 12–46)
MCH: 31.9 pg (ref 26.0–34.0)
MCHC: 36 g/dL (ref 30.0–36.0)
MCV: 88.5 fL (ref 78.0–100.0)
MONOS PCT: 7 % (ref 3–12)
Monocytes Absolute: 0.9 10*3/uL (ref 0.1–1.0)
NEUTROS PCT: 72 % (ref 43–77)
Neutro Abs: 9.7 10*3/uL — ABNORMAL HIGH (ref 1.7–7.7)
PLATELETS: 264 10*3/uL (ref 150–400)
RBC: 5.58 MIL/uL (ref 4.22–5.81)
RDW: 12.6 % (ref 11.5–15.5)
WBC: 13.3 10*3/uL — AB (ref 4.0–10.5)

## 2014-05-01 MED ORDER — OXYCODONE-ACETAMINOPHEN 5-325 MG PO TABS
2.0000 | ORAL_TABLET | Freq: Once | ORAL | Status: AC
Start: 2014-05-01 — End: 2014-05-01
  Administered 2014-05-01: 2 via ORAL
  Filled 2014-05-01: qty 2

## 2014-05-01 MED ORDER — VANCOMYCIN HCL 10 G IV SOLR
1500.0000 mg | Freq: Once | INTRAVENOUS | Status: AC
  Administered 2014-05-01: 1500 mg via INTRAVENOUS
  Filled 2014-05-01: qty 1500

## 2014-05-01 MED ORDER — HYDROCODONE-ACETAMINOPHEN 5-325 MG PO TABS
2.0000 | ORAL_TABLET | ORAL | Status: DC | PRN
Start: 2014-05-01 — End: 2014-05-03

## 2014-05-01 MED ORDER — CEPHALEXIN 500 MG PO CAPS: 500.0000 mg | ORAL_CAPSULE | Freq: Four times a day (QID) | ORAL | Status: DC

## 2014-05-01 NOTE — ED Notes (Signed)
Pt c/o moderate pain in left hand.  Medicated.  Tolerating p.o intake with no difficulty. No distress noted at present.

## 2014-05-01 NOTE — ED Provider Notes (Signed)
CSN: [id number]     Arrival date & time 05/01/14  1508 History  This chart was scribed for Rolland PorterMark Stefany Starace, MD by Murriel HopperAlec Bankhead, ED Scribe. This patient was seen in room APA14/APA14 and the patient's care was started at 5:55 PM.     Chief Complaint  Patient presents with  . Hand Pain     (Consider location/radiation/quality/duration/timing/severity/associated sxs/prior Treatment) The history is provided by the patient. No language interpreter was used.    HPI Comments: Ricardo Juarez is a 28 y.o. male who presents to the Emergency Department complaining of left hand pain that started a week PTA. Pt reports carrying a bath tub when he injured his left hand and LLE. Pt notes that a blood blister appeared on the hand the same day as the injury, and then ruptured a few hours later. Subsequently after the rupture of the blood blister, the pain in his hand began. Pt also reports radiating pain up left upper extremity. Pt reports difficulty closing and using hand due to injury as well. Pt was seen at Urgent care for LLE pain and was seen again yesterday 10/19 for worsening hand pain, and was referred to ED. Pt was prescribed antibiotic for injury to hand at Urgent Care facility.   History reviewed. No pertinent past medical history. Past Surgical History  Procedure Laterality Date  . Appendectomy    . Hernia repair     History reviewed. No pertinent family history. History  Substance Use Topics  . Smoking status: Current Every Day Smoker -- 1.50 packs/day    Types: Cigarettes  . Smokeless tobacco: Not on file  . Alcohol Use: No    Review of Systems  All other systems reviewed and are negative.     Allergies  Amoxicillin and Penicillins  Home Medications   Prior to Admission medications   Medication Sig Start Date End Date Taking? Authorizing Provider  sulfamethoxazole-trimethoprim (BACTRIM DS,SEPTRA DS) 800-160 MG per tablet Take 1 tablet by mouth 2 (two) times daily. 10 day  course starting on 04/23/2014   Yes Historical Provider, MD  cephALEXin (KEFLEX) 500 MG capsule Take 1 capsule (500 mg total) by mouth 4 (four) times daily. 05/01/14   Rolland PorterMark Aubre Quincy, MD  HYDROcodone-acetaminophen (NORCO/VICODIN) 5-325 MG per tablet Take 2 tablets by mouth every 4 (four) hours as needed. 05/01/14   Rolland PorterMark Armon Orvis, MD   BP 130/81  Pulse 62  Temp(Src) 97.6 F (36.4 C) (Oral)  Resp 18  Ht 5\' 9"  (1.753 m)  Wt 155 lb (70.308 kg)  BMI 22.88 kg/m2  SpO2 100% Physical Exam  Nursing note and vitals reviewed. Constitutional: He is oriented to person, place, and time. He appears well-developed and well-nourished. No distress.  HENT:  Head: Normocephalic.  Eyes: Conjunctivae are normal. Pupils are equal, round, and reactive to light. No scleral icterus.  Neck: Normal range of motion. Neck supple. No thyromegaly present.  Cardiovascular: Normal rate and regular rhythm.  Exam reveals no gallop and no friction rub.   No murmur heard. Pulmonary/Chest: Effort normal and breath sounds normal. No respiratory distress. He has no wheezes. He has no rales.  Abdominal: Soft. Bowel sounds are normal. He exhibits no distension. There is no tenderness. There is no rebound.  Musculoskeletal:  Swelling at base of L ring finger at MCP Soft tissue swelling dorsally across 3rd, 4th, 5th digits, as well as at MCP Immediate capillary refill  Nontender over the flexor tendon proximal to the MCP. I am able to passively extend  the digit without significant pain and no pain refers up the arm. This is not an exam consistent with an acute tenosynovitis  Neurological: He is alert and oriented to person, place, and time.  Skin: Skin is warm and dry. No rash noted.  Psychiatric: He has a normal mood and affect. His behavior is normal.    ED Course  Procedures   DIAGNOSTIC STUDIES: Oxygen Saturation is 99% on RA, normal by my interpretation.    COORDINATION OF CARE: 6:02 PM Discussed treatment plan with pt  at bedside and pt agreed to plan.   Labs Review Labs Reviewed  CBC WITH DIFFERENTIAL - Abnormal; Notable for the following:    WBC 13.3 (*)    Hemoglobin 17.8 (*)    Neutro Abs 9.7 (*)    All other components within normal limits  BASIC METABOLIC PANEL - Abnormal; Notable for the following:    GFR calc non Af Amer 74 (*)    GFR calc Af Amer 85 (*)    All other components within normal limits    Imaging Review Dg Hand Complete Left  05/01/2014   CLINICAL DATA:  Pain, left hand injury at work in October 12, left hand blister and swelling October 15  EXAM: LEFT HAND - COMPLETE 3+ VIEW  COMPARISON:  04/30/2014  FINDINGS: Three views of left hand submitted. No acute fracture or subluxation. No radiopaque foreign body.  IMPRESSION: Negative.   Electronically Signed   By: Natasha MeadLiviu  Pop M.D.   On: 05/01/2014 19:03     EKG Interpretation None      MDM   Final diagnoses:  Cellulitis of hand   Discussed case with Dr. Essie ChristineGremminger on to have surgery. Patient's exam is not consistent with a an obvious tenosynovitis. Seems to be cellulitic. However I discussed with the hand surgeon on call. We'll plan on antibiotics here in rechecked within 24-40 hours if not improving. At that point Dr. Amanda PeaGramig felt that he or one of his partners could see the patient if his symptoms are not improving.  Discussed with the patient that I wanted him to strictly keep his handstrictly elevated over the next 48 hours.  I personally performed the services described in this documentation, which was scribed in my presence. The recorded information has been reviewed and is accurate.    Rolland PorterMark Leigha Olberding, MD 05/01/14 2039

## 2014-05-01 NOTE — Progress Notes (Addendum)
Pharmacy Note:  Initial antibiotics for Vancomycin ordered by EDP for PNA (though no note yet and hand surgery consult noted).  Likely treating cellulitis.  Estimated Creatinine Clearance: 84 ml/min (by C-G formula based on Cr of 1.29).   Allergies  Allergen Reactions  . Amoxicillin Swelling    Swelling of throat   . Penicillins Swelling    Swelling of throat    Filed Vitals:   05/01/14 1748  BP: 130/81  Pulse: 62  Temp:   Resp: 18    Anti-infectives   None      Plan: Vancomycin 1500mg  IV x 1 now. F/U admission orders for further dosing if therapy continued.  Mady GemmaHayes, Dalon Reichart R, [facility] 05/01/2014 6:36 PM

## 2014-05-01 NOTE — ED Notes (Signed)
Pain , swelling  Lt hand   Had "blisters " to hand , then fell on it.  Redness and swelling,  See  At Urgent Care and has started septra

## 2014-05-01 NOTE — Discharge Instructions (Signed)
For the next 48 hours strictly keep your hand elevated above your heart. Continue the Bactrim prescription, and take the Keflex as prescribed as well. Recheck here in 48 hours if not markedly improved. If at any point if you feel your hand is is getting worse, then recheck immediately.   Cellulitis Cellulitis is an infection of the skin and the tissue under the skin. The infected area is usually red and tender. This happens most often in the arms and lower legs. HOME CARE   Take your antibiotic medicine as told. Finish the medicine even if you start to feel better.  Keep the infected arm or leg raised (elevated).  Put a warm cloth on the area up to 4 times per day.  Only take medicines as told by your doctor.  Keep all doctor visits as told. GET HELP IF:  You see red streaks on the skin coming from the infected area.  Your red area gets bigger or turns a dark color.  Your bone or joint under the infected area is painful after the skin heals.  Your infection comes back in the same area or different area.  You have a puffy (swollen) bump in the infected area.  You have new symptoms.  You have a fever. GET HELP RIGHT AWAY IF:   You feel very sleepy.  You throw up (vomit) or have watery poop (diarrhea).  You feel sick and have muscle aches and pains. MAKE SURE YOU:   Understand these instructions.  Will watch your condition.  Will get help right away if you are not doing well or get worse. Document Released: 12/16/2007 Document Revised: 11/13/2013 Document Reviewed: 09/14/2011 [facility] Patient Information 2015 [place]. This information is not intended to replace advice given to you by your health care provider. Make sure you discuss any questions you have with your health care provider.

## 2014-05-01 NOTE — ED Notes (Signed)
MD at the bedside  

## 2014-05-02 ENCOUNTER — Ambulatory Visit (HOSPITAL_COMMUNITY)
Admission: RE | Admit: 2014-05-02 | Discharge: 2014-05-02 | Disposition: A | Discharge status: Home / Self Care | Payer: Self-pay | Source: Ambulatory Visit | Attending: Orthopedic Surgery | Admitting: Orthopedic Surgery

## 2014-05-02 ENCOUNTER — Other Ambulatory Visit (HOSPITAL_COMMUNITY): Payer: Self-pay | Admitting: Orthopedic Surgery

## 2014-05-02 DIAGNOSIS — L03119 Cellulitis of unspecified part of limb: Secondary | ICD-10-CM

## 2014-05-02 DIAGNOSIS — R52 Pain, unspecified: Secondary | ICD-10-CM

## 2014-05-02 MED ORDER — GADOBENATE DIMEGLUMINE 529 MG/ML IV SOLN
14.0000 mL | Freq: Once | INTRAVENOUS | Status: AC | PRN
  Administered 2014-05-02: 14 mL via INTRAVENOUS

## 2014-05-03 ENCOUNTER — Encounter (HOSPITAL_COMMUNITY): Payer: Self-pay | Admitting: *Deleted

## 2014-05-03 ENCOUNTER — Encounter (HOSPITAL_COMMUNITY): Payer: Worker's Compensation | Admitting: Anesthesiology

## 2014-05-03 ENCOUNTER — Other Ambulatory Visit (HOSPITAL_COMMUNITY): Payer: Worker's Compensation | Admitting: Orthopedic Surgery

## 2014-05-03 ENCOUNTER — Encounter (HOSPITAL_COMMUNITY)
Admission: AD | Disposition: A | Discharge status: Home / Self Care | Payer: Self-pay | Source: Ambulatory Visit | Attending: Orthopedic Surgery

## 2014-05-03 ENCOUNTER — Other Ambulatory Visit (HOSPITAL_COMMUNITY): Payer: Self-pay | Admitting: Orthopedic Surgery

## 2014-05-03 ENCOUNTER — Ambulatory Visit (HOSPITAL_COMMUNITY): Payer: Worker's Compensation | Admitting: Anesthesiology

## 2014-05-03 ENCOUNTER — Observation Stay (HOSPITAL_COMMUNITY)
Admission: AD | Admit: 2014-05-03 | Discharge: 2014-05-04 | Disposition: A | Discharge status: Home / Self Care | Payer: Worker's Compensation | Source: Ambulatory Visit | Attending: Orthopedic Surgery | Admitting: Orthopedic Surgery

## 2014-05-03 DIAGNOSIS — F17210 Nicotine dependence, cigarettes, uncomplicated: Secondary | ICD-10-CM | POA: Insufficient documentation

## 2014-05-03 DIAGNOSIS — L03114 Cellulitis of left upper limb: Secondary | ICD-10-CM | POA: Diagnosis not present

## 2014-05-03 DIAGNOSIS — F419 Anxiety disorder, unspecified: Secondary | ICD-10-CM | POA: Insufficient documentation

## 2014-05-03 DIAGNOSIS — S60512A Abrasion of left hand, initial encounter: Secondary | ICD-10-CM

## 2014-05-03 DIAGNOSIS — S60519A Abrasion of unspecified hand, initial encounter: Secondary | ICD-10-CM

## 2014-05-03 DIAGNOSIS — L089 Local infection of the skin and subcutaneous tissue, unspecified: Secondary | ICD-10-CM | POA: Diagnosis present

## 2014-05-03 HISTORY — DX: Anxiety disorder, unspecified: F41.9

## 2014-05-03 HISTORY — PX: I & D EXTREMITY: SHX5045

## 2014-05-03 SURGERY — IRRIGATION AND DEBRIDEMENT EXTREMITY
Anesthesia: General | Site: Hand | Laterality: Left

## 2014-05-03 MED ORDER — HYDROMORPHONE HCL 1 MG/ML IJ SOLN
INTRAMUSCULAR | Status: AC
  Filled 2014-05-03: qty 1

## 2014-05-03 MED ORDER — LIDOCAINE HCL (CARDIAC) 20 MG/ML IV SOLN
INTRAVENOUS | Status: AC
  Filled 2014-05-03: qty 5

## 2014-05-03 MED ORDER — PROPOFOL 10 MG/ML IV BOLUS
INTRAVENOUS | Status: AC
  Filled 2014-05-03: qty 20

## 2014-05-03 MED ORDER — ROCURONIUM BROMIDE 50 MG/5ML IV SOLN
INTRAVENOUS | Status: AC
  Filled 2014-05-03: qty 1

## 2014-05-03 MED ORDER — ONDANSETRON HCL 4 MG PO TABS: 4.0000 mg | ORAL_TABLET | Freq: Four times a day (QID) | ORAL | Status: DC | PRN

## 2014-05-03 MED ORDER — ONDANSETRON HCL 4 MG/2ML IJ SOLN: 4.0000 mg | Freq: Four times a day (QID) | INTRAMUSCULAR | Status: DC | PRN

## 2014-05-03 MED ORDER — MIDAZOLAM HCL 2 MG/2ML IJ SOLN
INTRAMUSCULAR | Status: AC
  Filled 2014-05-03: qty 2

## 2014-05-03 MED ORDER — LIDOCAINE HCL (CARDIAC) 10 MG/ML IV SOLN
INTRAVENOUS | Status: DC | PRN
  Administered 2014-05-03: 80 mg via INTRAVENOUS

## 2014-05-03 MED ORDER — HYDROMORPHONE HCL 1 MG/ML IJ SOLN
0.2500 mg | INTRAMUSCULAR | Status: DC | PRN
  Administered 2014-05-03 (×2): 0.5 mg via INTRAVENOUS

## 2014-05-03 MED ORDER — OXYCODONE HCL 5 MG PO TABS
5.0000 mg | ORAL_TABLET | ORAL | Status: DC | PRN
  Administered 2014-05-03: 10 mg via ORAL
  Filled 2014-05-03: qty 2

## 2014-05-03 MED ORDER — FENTANYL CITRATE 0.05 MG/ML IJ SOLN
INTRAMUSCULAR | Status: DC | PRN
  Administered 2014-05-03 (×2): 25 ug via INTRAVENOUS
  Administered 2014-05-03: 50 ug via INTRAVENOUS

## 2014-05-03 MED ORDER — MIDAZOLAM HCL 5 MG/5ML IJ SOLN
INTRAMUSCULAR | Status: DC | PRN
  Administered 2014-05-03 (×2): 1 mg via INTRAVENOUS

## 2014-05-03 MED ORDER — FENTANYL CITRATE 0.05 MG/ML IJ SOLN
INTRAMUSCULAR | Status: AC
  Filled 2014-05-03: qty 5

## 2014-05-03 MED ORDER — CIPROFLOXACIN IN D5W 400 MG/200ML IV SOLN
400.0000 mg | Freq: Two times a day (BID) | INTRAVENOUS | Status: AC
  Administered 2014-05-03 – 2014-05-04 (×2): 400 mg via INTRAVENOUS
  Filled 2014-05-03 (×2): qty 200

## 2014-05-03 MED ORDER — MIDAZOLAM HCL 2 MG/2ML IJ SOLN
INTRAMUSCULAR | Status: AC
  Administered 2014-05-03: 2 mg via INTRAVENOUS
  Filled 2014-05-03: qty 2

## 2014-05-03 MED ORDER — DEXAMETHASONE SODIUM PHOSPHATE 4 MG/ML IJ SOLN
INTRAMUSCULAR | Status: AC
  Filled 2014-05-03: qty 1

## 2014-05-03 MED ORDER — VANCOMYCIN HCL IN DEXTROSE 1-5 GM/200ML-% IV SOLN
1000.0000 mg | INTRAVENOUS | Status: AC
  Administered 2014-05-03: 1000 mg via INTRAVENOUS

## 2014-05-03 MED ORDER — OXYCODONE HCL 5 MG/5ML PO SOLN: 5.0000 mg | Freq: Once | ORAL | Status: DC | PRN

## 2014-05-03 MED ORDER — ONDANSETRON HCL 4 MG/2ML IJ SOLN
INTRAMUSCULAR | Status: AC
  Filled 2014-05-03: qty 4

## 2014-05-03 MED ORDER — MIDAZOLAM HCL 2 MG/2ML IJ SOLN
1.0000 mg | Freq: Once | INTRAMUSCULAR | Status: AC
  Administered 2014-05-03: 2 mg via INTRAVENOUS

## 2014-05-03 MED ORDER — DEXAMETHASONE SODIUM PHOSPHATE 4 MG/ML IJ SOLN
INTRAMUSCULAR | Status: DC | PRN
  Administered 2014-05-03: 4 mg via INTRAVENOUS

## 2014-05-03 MED ORDER — VANCOMYCIN HCL IN DEXTROSE 1-5 GM/200ML-% IV SOLN
1000.0000 mg | Freq: Two times a day (BID) | INTRAVENOUS | Status: AC
  Administered 2014-05-04 (×2): 1000 mg via INTRAVENOUS
  Filled 2014-05-03 (×2): qty 200

## 2014-05-03 MED ORDER — LACTATED RINGERS IV SOLN
INTRAVENOUS | Status: DC
  Administered 2014-05-03: 13:00:00 via INTRAVENOUS

## 2014-05-03 MED ORDER — LACTATED RINGERS IV SOLN
INTRAVENOUS | Status: DC | PRN
  Administered 2014-05-03 (×2): via INTRAVENOUS

## 2014-05-03 MED ORDER — MORPHINE SULFATE 2 MG/ML IJ SOLN: 1.0000 mg | INTRAMUSCULAR | Status: DC | PRN

## 2014-05-03 MED ORDER — ONDANSETRON HCL 4 MG/2ML IJ SOLN
INTRAMUSCULAR | Status: DC | PRN
  Administered 2014-05-03: 4 mg via INTRAVENOUS

## 2014-05-03 MED ORDER — PROPOFOL 10 MG/ML IV BOLUS
INTRAVENOUS | Status: DC | PRN
  Administered 2014-05-03: 100 mg via INTRAVENOUS
  Administered 2014-05-03: 200 mg via INTRAVENOUS

## 2014-05-03 MED ORDER — 0.9 % SODIUM CHLORIDE (POUR BTL) OPTIME
TOPICAL | Status: DC | PRN
  Administered 2014-05-03: 1000 mL

## 2014-05-03 MED ORDER — METOCLOPRAMIDE HCL 10 MG PO TABS: 5.0000 mg | ORAL_TABLET | Freq: Three times a day (TID) | ORAL | Status: DC | PRN

## 2014-05-03 MED ORDER — VANCOMYCIN HCL IN DEXTROSE 1-5 GM/200ML-% IV SOLN
INTRAVENOUS | Status: AC
  Filled 2014-05-03: qty 200

## 2014-05-03 MED ORDER — OXYCODONE HCL 5 MG PO TABS
5.0000 mg | ORAL_TABLET | Freq: Once | ORAL | Status: DC | PRN
Start: 2014-05-03 — End: 2014-05-03

## 2014-05-03 MED ORDER — METOCLOPRAMIDE HCL 5 MG/ML IJ SOLN: 5.0000 mg | Freq: Three times a day (TID) | INTRAMUSCULAR | Status: DC | PRN

## 2014-05-03 SURGICAL SUPPLY — 64 items
BANDAGE ELASTIC 3 VELCRO ST LF (GAUZE/BANDAGES/DRESSINGS) ×2 IMPLANT
BANDAGE ELASTIC 4 VELCRO ST LF (GAUZE/BANDAGES/DRESSINGS) IMPLANT
BNDG COHESIVE 4X5 TAN STRL (GAUZE/BANDAGES/DRESSINGS) IMPLANT
BNDG CONFORM 3 STRL LF (GAUZE/BANDAGES/DRESSINGS) ×2 IMPLANT
BNDG GAUZE ELAST 4 BULKY (GAUZE/BANDAGES/DRESSINGS) IMPLANT
CANISTER SUCTION 2500CC (MISCELLANEOUS) ×2 IMPLANT
CORDS BIPOLAR (ELECTRODE) ×2 IMPLANT
COVER SURGICAL LIGHT HANDLE (MISCELLANEOUS) ×3 IMPLANT
CUFF TOURNIQUET SINGLE 18IN (TOURNIQUET CUFF) ×3 IMPLANT
CUFF TOURNIQUET SINGLE 24IN (TOURNIQUET CUFF) IMPLANT
CUFF TOURNIQUET SINGLE 34IN LL (TOURNIQUET CUFF) IMPLANT
CUFF TOURNIQUET SINGLE 44IN (TOURNIQUET CUFF) IMPLANT
DRAPE SURG 17X11 SM STRL (DRAPES) ×2 IMPLANT
DRAPE U-SHAPE 47X51 STRL (DRAPES) ×1 IMPLANT
DRSG PAD ABDOMINAL 8X10 ST (GAUZE/BANDAGES/DRESSINGS) IMPLANT
DURAPREP 26ML APPLICATOR (WOUND CARE) ×1 IMPLANT
ELECT REM PT RETURN 9FT ADLT (ELECTROSURGICAL)
ELECTRODE REM PT RTRN 9FT ADLT (ELECTROSURGICAL) IMPLANT
FACESHIELD WRAPAROUND (MASK) ×3 IMPLANT
FACESHIELD WRAPAROUND OR TEAM (MASK) ×1 IMPLANT
GAUZE PACKING IODOFORM 1/4X15 (GAUZE/BANDAGES/DRESSINGS) ×2 IMPLANT
GAUZE SPONGE 4X4 12PLY STRL (GAUZE/BANDAGES/DRESSINGS) IMPLANT
GAUZE XEROFORM 1X8 LF (GAUZE/BANDAGES/DRESSINGS) ×2 IMPLANT
GAUZE XEROFORM 5X9 LF (GAUZE/BANDAGES/DRESSINGS) IMPLANT
GLOVE BIO SURGEON STRL SZ8 (GLOVE) ×2 IMPLANT
GLOVE BIOGEL PI IND STRL 6.5 (GLOVE) IMPLANT
GLOVE BIOGEL PI IND STRL 8 (GLOVE) ×1 IMPLANT
GLOVE BIOGEL PI INDICATOR 6.5 (GLOVE) ×4
GLOVE BIOGEL PI INDICATOR 8 (GLOVE) ×6
GLOVE SURG ORTHO 8.0 STRL STRW (GLOVE) ×3 IMPLANT
GOWN STRL REUS W/ TWL LRG LVL3 (GOWN DISPOSABLE) ×2 IMPLANT
GOWN STRL REUS W/ TWL XL LVL3 (GOWN DISPOSABLE) ×1 IMPLANT
GOWN STRL REUS W/TWL LRG LVL3 (GOWN DISPOSABLE) ×6
GOWN STRL REUS W/TWL XL LVL3 (GOWN DISPOSABLE) ×3
HANDPIECE INTERPULSE COAX TIP (DISPOSABLE)
HOOD PEEL AWAY FACE SHEILD DIS (HOOD) ×4 IMPLANT
KIT BASIN OR (CUSTOM PROCEDURE TRAY) ×3 IMPLANT
KIT ROOM TURNOVER OR (KITS) ×3 IMPLANT
LOOP VESSEL MAXI BLUE (MISCELLANEOUS) ×2 IMPLANT
MANIFOLD NEPTUNE II (INSTRUMENTS) ×1 IMPLANT
NS IRRIG 1000ML POUR BTL (IV SOLUTION) ×3 IMPLANT
PACK ORTHO EXTREMITY (CUSTOM PROCEDURE TRAY) ×3 IMPLANT
PAD ARMBOARD 7.5X6 YLW CONV (MISCELLANEOUS) ×6 IMPLANT
PAD CAST 4YDX4 CTTN HI CHSV (CAST SUPPLIES) IMPLANT
PADDING CAST COTTON 4X4 STRL (CAST SUPPLIES)
SET HNDPC FAN SPRY TIP SCT (DISPOSABLE) IMPLANT
SPONGE GAUZE 4X4 12PLY STER LF (GAUZE/BANDAGES/DRESSINGS) ×2 IMPLANT
SPONGE LAP 18X18 X RAY DECT (DISPOSABLE) ×9 IMPLANT
SPONGE LAP 4X18 X RAY DECT (DISPOSABLE) ×3 IMPLANT
STOCKINETTE IMPERVIOUS 9X36 MD (GAUZE/BANDAGES/DRESSINGS) ×3 IMPLANT
SUT ETHILON 2 0 FS 18 (SUTURE) IMPLANT
SUT ETHILON 3 0 PS 1 (SUTURE) ×4 IMPLANT
SUT ETHILON 4 0 PS 2 18 (SUTURE) IMPLANT
SUT PROLENE 3 0 PS 2 (SUTURE) IMPLANT
SUT VIC AB 3-0 SH 27 (SUTURE)
SUT VIC AB 3-0 SH 27X BRD (SUTURE) IMPLANT
TOWEL OR 17X24 6PK STRL BLUE (TOWEL DISPOSABLE) ×3 IMPLANT
TOWEL OR 17X26 10 PK STRL BLUE (TOWEL DISPOSABLE) ×3 IMPLANT
TUBE ANAEROBIC SPECIMEN COL (MISCELLANEOUS) IMPLANT
TUBE CONNECTING 12'X1/4 (SUCTIONS) ×1
TUBE CONNECTING 12X1/4 (SUCTIONS) ×2 IMPLANT
UNDERPAD 30X30 INCONTINENT (UNDERPADS AND DIAPERS) ×3 IMPLANT
WATER STERILE IRR 1000ML POUR (IV SOLUTION) ×1 IMPLANT
YANKAUER SUCT BULB TIP NO VENT (SUCTIONS) ×3 IMPLANT

## 2014-05-03 NOTE — Anesthesia Postprocedure Evaluation (Signed)
  Anesthesia Post-op Note  Patient: Ricardo Juarez  Procedure(s) Performed: Procedure(s): Irrigation and Debridement Left Hand (Left)  Patient Location: PACU  Anesthesia Type:General  Level of Consciousness: awake, alert , oriented and patient cooperative  Airway and Oxygen Therapy: Patient Spontanous Breathing  Post-op Pain: mild  Post-op Assessment: Post-op Vital signs reviewed, Patient's Cardiovascular Status Stable, Respiratory Function Stable, Patent Airway, No signs of Nausea or vomiting and Pain level controlled  Post-op Vital Signs: Reviewed and stable  Last Vitals:  Filed Vitals:   05/03/14 1850  BP:   Pulse: 83  Temp:   Resp: 26    Complications: No apparent anesthesia complications

## 2014-05-03 NOTE — Anesthesia Procedure Notes (Signed)
Procedure Name: LMA Insertion Date/Time: 05/03/2014 5:08 PM Performed by: Leonel Ramsay'LAUGHLIN, Adilynne Fitzwater H Pre-anesthesia Checklist: Patient identified, Timeout performed, Emergency Drugs available, Suction available and Patient being monitored Patient Re-evaluated:Patient Re-evaluated prior to inductionOxygen Delivery Method: Circle system utilized Preoxygenation: Pre-oxygenation with 100% oxygen Intubation Type: IV induction Ventilation: Mask ventilation without difficulty LMA: LMA inserted LMA Size: 5.0 Number of attempts: 2 Placement Confirmation: positive ETCO2 and breath sounds checked- equal and bilateral Tube secured with: Tape Dental Injury: Teeth and Oropharynx as per pre-operative assessment  Comments: LMA #4 inserted initially with large leak, unable to seat well. LMA #5 placed easily, seated well. Airway manipulation atraumatic.

## 2014-05-03 NOTE — Anesthesia Preprocedure Evaluation (Signed)
Anesthesia Evaluation  Patient identified by MRN, date of birth, ID band Patient awake    Reviewed: Allergy & Precautions, H&P , NPO status , Patient's Chart, lab work & pertinent test results  Airway Mallampati: II  Neck ROM: full    Dental   Pulmonary Current Smoker,          Cardiovascular negative cardio ROS      Neuro/Psych Anxiety    GI/Hepatic   Endo/Other    Renal/GU      Musculoskeletal   Abdominal   Peds  Hematology   Anesthesia Other Findings   Reproductive/Obstetrics                           Anesthesia Physical Anesthesia Plan  ASA: II  Anesthesia Plan: General   Post-op Pain Management:    Induction: Intravenous  Airway Management Planned: LMA  Additional Equipment:   Intra-op Plan:   Post-operative Plan:   Informed Consent: I have reviewed the patients History and Physical, chart, labs and discussed the procedure including the risks, benefits and alternatives for the proposed anesthesia with the patient or authorized representative who has indicated his/her understanding and acceptance.     Plan Discussed with: CRNA, Anesthesiologist and Surgeon  Anesthesia Plan Comments:         Anesthesia Quick Evaluation

## 2014-05-03 NOTE — Progress Notes (Signed)
Dr. Chaney MallingHodierne called and informed that pt reports drinking about 6 oz of Dr. Reino KentPepper at 1000 and eating a cup of dry Apple Jacks at 0500, no new orders received.

## 2014-05-03 NOTE — Brief Op Note (Signed)
05/03/2014  6:00 PM  PATIENT:  Ricardo Juarez  28 y.o. male  PRE-OPERATIVE DIAGNOSIS:  Cellulitis Left Hand L03.119  POST-OPERATIVE DIAGNOSIS:  Cellulitis left hand - with abcess base of ring finger  PROCEDURE:  Procedure(s): Irrigation and Debridement Left Hand  SURGEON:  Surgeon(s): Cammy CopaGregory Scott Dean, MD  ASSISTANT: none  ANESTHESIA:   general  EBL: 10 ml    Total I/O In: 1000 [I.V.:1000] Out: 10 [Blood:10]  BLOOD ADMINISTERED: none  DRAINS: iodoform x 2   LOCAL MEDICATIONS USED:  none  SPECIMEN:  No Specimen  COUNTS:  YES  TOURNIQUET:   Total Tourniquet Time Documented: Upper Arm (Left) - 19 minutes Total: Upper Arm (Left) - 19 minutes   DICTATION: .Other Dictation: Dictation Number [id number]  PLAN OF CARE: Admit for overnight observation  PATIENT DISPOSITION:  PACU - hemodynamically stable

## 2014-05-03 NOTE — Transfer of Care (Signed)
Immediate Anesthesia Transfer of Care Note  Patient: Ricardo Juarez  Procedure(s) Performed: Procedure(s): Irrigation and Debridement Left Hand (Left)  Patient Location: PACU  Anesthesia Type:General  Level of Consciousness: awake, alert  and oriented  Airway & Oxygen Therapy: Patient Spontanous Breathing and Patient connected to nasal cannula oxygen  Post-op Assessment: Report given to PACU RN and Post -op Vital signs reviewed and stable  Post vital signs: Reviewed and stable  Complications: No apparent anesthesia complications

## 2014-05-03 NOTE — Discharge Instructions (Signed)

## 2014-05-03 NOTE — H&P (Signed)
Ricardo Juarez is an 28 y.o. male.   Chief Complaint: Left hand pain HPI: Ricardo Juarez is a 28 year old patient with left hand pain for 2 weeks ago he had an injury to the left hand which was at work where the child landed on his hand gave him some type of blood blister right at the proximal flexion crease of the ring finger on the left hand. He subsequently developed pain and swelling in this area as a blood blister involved. The knee has become severe the past several days he reports dorsal hand swelling now. He has been placed on some antibiotics but his history of taking them are still unclear. He denies any fever and chills. Does report a little numbness in the ring finger and does report increasing dorsal swelling.  Past Medical History  Diagnosis Date  . Anxiety     Panic attacks    Past Surgical History  Procedure Laterality Date  . Appendectomy    . Hernia repair    . Root canal      History reviewed. No pertinent family history. Social History:  reports that he has been smoking Cigarettes.  He has been smoking about 1.50 packs per day. He has never used smokeless tobacco. He reports that he does not drink alcohol or use illicit drugs.  Allergies:  Allergies  Allergen Reactions  . Amoxicillin Swelling    Swelling of throat   . Penicillins Swelling    Swelling of throat    Medications Prior to Admission  Medication Sig Dispense Refill  . cephALEXin (KEFLEX) 500 MG capsule Take 1 capsule (500 mg total) by mouth 4 (four) times daily.  20 capsule  0  . HYDROcodone-acetaminophen (NORCO/VICODIN) 5-325 MG per tablet Take 1 tablet by mouth every 4 (four) hours as needed for moderate pain.        Results for orders placed during the hospital encounter of 05/01/14 (from the past 48 hour(s))  CBC WITH DIFFERENTIAL     Status: Abnormal   Collection Time    05/01/14  5:23 PM      Result Value Ref Range   WBC 13.3 (*) 4.0 - 10.5 K/uL   RBC 5.58  4.22 - 5.81 MIL/uL   Hemoglobin 17.8  (*) 13.0 - 17.0 g/dL   HCT 49.4  39.0 - 52.0 %   MCV 88.5  78.0 - 100.0 fL   MCH 31.9  26.0 - 34.0 pg   MCHC 36.0  30.0 - 36.0 g/dL   RDW 12.6  11.5 - 15.5 %   Platelets 264  150 - 400 K/uL   Neutrophils Relative % 72  43 - 77 %   Neutro Abs 9.7 (*) 1.7 - 7.7 K/uL   Lymphocytes Relative 18  12 - 46 %   Lymphs Abs 2.3  0.7 - 4.0 K/uL   Monocytes Relative 7  3 - 12 %   Monocytes Absolute 0.9  0.1 - 1.0 K/uL   Eosinophils Relative 2  0 - 5 %   Eosinophils Absolute 0.3  0.0 - 0.7 K/uL   Basophils Relative 1  0 - 1 %   Basophils Absolute 0.1  0.0 - 0.1 K/uL  BASIC METABOLIC PANEL     Status: Abnormal   Collection Time    05/01/14  5:23 PM      Result Value Ref Range   Sodium 142  137 - 147 mEq/L   Potassium 3.8  3.7 - 5.3 mEq/L   Chloride 101  96 -  112 mEq/L   CO2 29  19 - 32 mEq/L   Glucose, Bld 87  70 - 99 mg/dL   BUN 10  6 - 23 mg/dL   Creatinine, Ser 1.29  0.50 - 1.35 mg/dL   Calcium 9.4  8.4 - 10.5 mg/dL   GFR calc non Af Amer 74 (*) >90 mL/min   GFR calc Af Amer 85 (*) >90 mL/min   Comment: (NOTE)     The eGFR has been calculated using the CKD EPI equation.     This calculation has not been validated in all clinical situations.     eGFR's persistently <90 mL/min signify possible Chronic Kidney     Disease.   Anion gap 12  5 - 15   Mr Hand Left W Wo Contrast  05/02/2014   CLINICAL DATA:  Left hand injury 1 week ago, with pain radiating in the left upper extremity. Cellulitis.  EXAM: MRI OF THE LEFT HAND WITHOUT AND WITH CONTRAST  TECHNIQUE: Multiplanar, multisequence MR imaging was performed both before and after administration of intravenous contrast.  CONTRAST:  31m MULTIHANCE GADOBENATE DIMEGLUMINE 529 MG/ML IV SOLN  COMPARISON:  05/01/2014  FINDINGS: Abnormal subcutaneous edema and enhancement tracking along the is dorsum of the hand medially from the level of the carpus to the bases of the middle, index, and small fingers. This abnormal edema and enhancement in the  subcutaneous tissues extends distally to the distal phalangeal level. On images 12 through 14 of series 11, there seems to be a transverse band of fluid or a wound within the volar subcutaneous tissues centered at the ring finger just proximal to the termination of the web spaces. This could represent an incipient abscess and measures 29 mm transverse by up to 3 mm proximal to distal by 3 mm anterior-posterior.  Some of the edema tracks along the flexor tendons of the middle, ring, and small fingers. No definite abnormal osseous edema or abnormal osseous enhancement.  IMPRESSION: 1. Abnormal subcutaneous edema and enhancement tracking in the medial hand from the level of the carpal bones up into the middle, index, and ring fingers compatible with cellulitis. There is also a small transverse band of nonenhancing fluid along the distal palm just below the termination of the web space centered at the ring finger, with well under 0.5 cc of volume and accordingly probably not a drainable abscess.   Electronically Signed   By: WSherryl BartersM.D.   On: 05/02/2014 20:02   Dg Hand Complete Left  05/01/2014   CLINICAL DATA:  Pain, left hand injury at work in October 12, left hand blister and swelling October 15  EXAM: LEFT HAND - COMPLETE 3+ VIEW  COMPARISON:  04/30/2014  FINDINGS: Three views of left hand submitted. No acute fracture or subluxation. No radiopaque foreign body.  IMPRESSION: Negative.   Electronically Signed   By: LLahoma CrockerM.D.   On: 05/01/2014 19:03    Review of Systems  Constitutional: Negative.   HENT: Negative.   Eyes: Negative.   Respiratory: Negative.   Cardiovascular: Negative.   Gastrointestinal: Negative.   Genitourinary: Negative.   Musculoskeletal: Positive for joint pain.  Skin: Negative.   Neurological: Negative.   Endo/Heme/Allergies: Negative.   Psychiatric/Behavioral: Negative.     Blood pressure 148/82, pulse 84, temperature 98.2 F (36.8 C), temperature source  Oral, resp. rate 20, weight 70.308 kg (155 lb), SpO2 100.00%. Physical Exam  Constitutional: He appears well-developed.  HENT:  Head: Normocephalic.  Eyes: Pupils  are equal, round, and reactive to light.  Neck: Normal range of motion.  Cardiovascular: Normal rate.   Respiratory: Effort normal.  Neurological: He is alert.  Skin: Skin is warm.  Psychiatric: He has a normal mood and affect.   examination the left hand demonstrates some erythema and swelling at the base the ring finger and no tenderness to direct palpation over the flexor tendon he does have some dorsal swelling. No tissue crepitus is present a little bit of erythema is noted at that flexion crease. and long finger  Assessment/Plan Impression is left hand infection early with some dorsal swelling and extension MRI scan does show a small linear fluid collection at the flexion crease of the finger. Plan is for I&D with antibiotics. He does not appear that he has a flexor tendon sheath infection at this time. Plan overnight IV antibiotics discharge home tomorrow alternatively may use 2 doses of IV antibiotics preop and postop. Patient understands risk and benefits including to infection finger stiffness nerve vessel damage all questions answered  Shakita Keir SCOTT 05/03/2014, 4:41 PM

## 2014-05-04 ENCOUNTER — Encounter (HOSPITAL_COMMUNITY): Payer: Self-pay | Admitting: Orthopedic Surgery

## 2014-05-04 DIAGNOSIS — L03114 Cellulitis of left upper limb: Secondary | ICD-10-CM | POA: Diagnosis not present

## 2014-05-04 MED ORDER — DOXYCYCLINE HYCLATE 50 MG PO CAPS: 100.0000 mg | ORAL_CAPSULE | Freq: Two times a day (BID) | ORAL | Status: DC

## 2014-05-04 MED ORDER — OXYCODONE HCL 5 MG PO TABS: 5.0000 mg | ORAL_TABLET | ORAL | Status: DC | PRN

## 2014-05-04 NOTE — Progress Notes (Signed)
UR completed 

## 2014-05-04 NOTE — Progress Notes (Signed)
Pt was up ad lib, no complaints of pain or discomfort. Family at bedside awaiting discharge.

## 2014-05-04 NOTE — Op Note (Signed)
NAME:  Ricardo Juarez, Ricardo Juarez              ACCOUNT NO.:  [id number]  MEDICAL RECORD NO.:  [id number]  LOCATION:  [id number]                        FACILITY:  [facility]  PHYSICIAN:  Burnard BuntingG. Scott Dabria Wadas, M.D.    DATE OF BIRTH:  October 17, 1985  DATE OF PROCEDURE: DATE OF DISCHARGE:                              OPERATIVE REPORT   PREOPERATIVE DIAGNOSIS:  Left hand infection.  POSTOPERATIVE DIAGNOSIS:  Left hand infection.  PROCEDURE:  Left hand I and D.  SURGEON:  Burnard BuntingG. Scott Stephanieann Popescu, MD  ASSISTANT:  None.  ANESTHESIA:  General.  INDICATIONS:  [name] is a patient who is 2 weeks out from a work injury.  He has got lot of pain, swelling, and erythema at the base of the ring finger on his left hand.  MRI scan last night showed some linear fluid collection and is fairly consistent with abscess.  He reports consistent pain.  He has been somewhat uncooperative in his antibiotic regimen which was prescribed by another physician.  He presents now for operative management of infection.  On MRI scan, there was no evidence of flexor tendon sheath infection.  PROCEDURE IN DETAIL:  The patient was brought to operating room where general anesthetic was induced.  Preop antibiotics were held until cultures were obtained.  Arm was elevated, but not exsanguinated.  The tourniquet was inflated.  Total time of 19 minutes.  Brunner incision was made based at one beginning at the ulnar corner of the ring finger flexion crease, taken proximally for approximately 4 cm.  Upon entering the skin, purulent fluid collection was encountered.  This did track over to the base of the long finger and the base of the small finger. The cultures were obtained.  Neurovascular bundles were more dorsal in the incision and were protected.  Thorough irrigation was performed with a liter of irrigating solution after exploring the area where the infection was present.  No infection could be expressed from the dorsal aspect of the hand upon the floor  of the palmar surface.  At this time, following thorough irrigation, tourniquet was released.  Bleeding points controlled using bipolar electrocautery.  The incision was closed loosely over iodoform packing in the incision.  Plan to pull the packing tomorrow.  Soft dressing applied.  Patient tolerated procedure well without immediate complications, transferred to recovery room in stable condition.     Burnard BuntingG. Scott Yeily Link, M.D.     GSD/MEDQ  D:  05/03/2014  T:  05/03/2014  Job:  [id number]

## 2014-05-04 NOTE — Progress Notes (Signed)
Patient provided with discharge instructions and follow up information. He has been provided with education on importance of taking antibiotics the full course. He is going home at this time with family.

## 2014-05-04 NOTE — Progress Notes (Signed)
Packing from hand removed

## 2014-05-04 NOTE — Progress Notes (Signed)
Pt stable Pain ok Finger perfused mobile cxs pending Dc today after last dose abx

## 2014-05-06 LAB — WOUND CULTURE

## 2014-05-08 LAB — ANAEROBIC CULTURE

## 2014-05-15 NOTE — Discharge Summary (Signed)
Physician Discharge Summary  Patient ID: Ricardo Juarez MRN: [id number] DOB/AGE: [birth year] 28 y.o.  Admit date: 05/03/2014 Discharge date: 05/04/2014  Admission Diagnoses:  Active Problems:   Abrasion, hand with infection   Discharge Diagnoses:  Same  Surgeries: Procedure(s): Irrigation and Debridement Left Hand on 05/03/2014   Consultants:    Discharged Condition: Stable  Hospital Course: Ricardo Juarez is an 28 y.o. male who was admitted 05/03/2014 with a chief complaint of hand pain, and found to have a diagnosis of hand infection.  They were brought to the operating room on 05/03/2014 and underwent the above named procedures.  He did well post op and was placed on two doses of post op iv abx and sent home with po abx for the infection  Antibiotics given:  Anti-infectives    Start     Dose/Rate Route Frequency Ordered Stop   05/04/14 0600  vancomycin (VANCOCIN) IVPB 1000 mg/200 mL premix     1,000 mg200 mL/hr over 60 Minutes Intravenous On call to O.R. 05/03/14 1307 05/03/14 1827   05/04/14 0130  vancomycin (VANCOCIN) IVPB 1000 mg/200 mL premix     1,000 mg200 mL/hr over 60 Minutes Intravenous Every 12 hours 05/03/14 1934 05/04/14 1359   05/04/14 0000  doxycycline (VIBRAMYCIN) 50 MG capsule     100 mg Oral 2 times daily 05/04/14 0809     05/03/14 2000  ciprofloxacin (CIPRO) IVPB 400 mg     400 mg200 mL/hr over 60 Minutes Intravenous Every 12 hours 05/03/14 1934 05/04/14 0923   05/03/14 1310  vancomycin (VANCOCIN) 1 GM/200ML IVPB    Comments:  Ricardo Juarez   : cabinet override      05/03/14 1310 05/04/14 0114    .  Recent vital signs:  Filed Vitals:   05/04/14 0800  BP: 121/72  Pulse: 55  Temp: 98.1 F (36.7 C)  Resp: 18    Recent laboratory studies:  Results for orders placed or performed during the hospital encounter of 05/03/14  Anaerobic culture  Result Value Ref Range   Specimen Description WOUND HAND LEFT    Special Requests PATIENT ON  FOLLOWING KEFLEX    Gram Stain      ABUNDANT WBC PRESENT,BOTH PMN AND MONONUCLEAR NO SQUAMOUS EPITHELIAL CELLS SEEN RARE GRAM POSITIVE COCCI IN PAIRS Performed at [facility]   Culture      NO ANAEROBES ISOLATED Performed at [facility]   Report Status 05/08/2014 FINAL   Wound culture  Result Value Ref Range   Specimen Description WOUND HAND LEFT    Special Requests PATIENT ON FOLLOWING KEFLEX    Gram Stain      ABUNDANT WBC PRESENT,BOTH PMN AND MONONUCLEAR NO SQUAMOUS EPITHELIAL CELLS SEEN RARE GRAM POSITIVE COCCI IN PAIRS Performed at [facility]   Culture      FEW STAPHYLOCOCCUS AUREUS Note: RIFAMPIN AND GENTAMICIN SHOULD NOT BE USED AS SINGLE DRUGS FOR TREATMENT OF STAPH INFECTIONS. Performed at [facility]   Report Status 05/06/2014 FINAL    Organism ID, Bacteria STAPHYLOCOCCUS AUREUS       Susceptibility   Staphylococcus aureus - MIC*    CLINDAMYCIN <=0.25 SENSITIVE Sensitive     ERYTHROMYCIN <=0.25 SENSITIVE Sensitive     GENTAMICIN <=0.5 SENSITIVE Sensitive     LEVOFLOXACIN 4 INTERMEDIATE Intermediate     OXACILLIN 0.5 SENSITIVE Sensitive     PENICILLIN 0.12 SENSITIVE Sensitive     RIFAMPIN <=0.5 SENSITIVE Sensitive     TRIMETH/SULFA <=10 SENSITIVE  Sensitive     VANCOMYCIN 1 SENSITIVE Sensitive     TETRACYCLINE <=1 SENSITIVE Sensitive     MOXIFLOXACIN 2 RESISTANT Resistant     * FEW STAPHYLOCOCCUS AUREUS    Discharge Medications:     Medication List    STOP taking these medications        cephALEXin 500 MG capsule  Commonly known as:  KEFLEX     HYDROcodone-acetaminophen 5-325 MG per tablet  Commonly known as:  NORCO/VICODIN      TAKE these medications        doxycycline 50 MG capsule  Commonly known as:  VIBRAMYCIN  Take 2 capsules (100 mg total) by mouth 2 (two) times daily.     oxyCODONE 5 MG immediate release tablet  Commonly known as:  Oxy IR/ROXICODONE  Take 1-2 tablets (5-10 mg total) by mouth every 3  (three) hours as needed for breakthrough pain.        Diagnostic Studies: Mr Hand Left W Wo Contrast  05/02/2014   CLINICAL DATA:  Left hand injury 1 week ago, with pain radiating in the left upper extremity. Cellulitis.  EXAM: MRI OF THE LEFT HAND WITHOUT AND WITH CONTRAST  TECHNIQUE: Multiplanar, multisequence MR imaging was performed both before and after administration of intravenous contrast.  CONTRAST:  14mL MULTIHANCE GADOBENATE DIMEGLUMINE 529 MG/ML IV SOLN  COMPARISON:  05/01/2014  FINDINGS: Abnormal subcutaneous edema and enhancement tracking along the is dorsum of the hand medially from the level of the carpus to the bases of the middle, index, and small fingers. This abnormal edema and enhancement in the subcutaneous tissues extends distally to the distal phalangeal level. On images 12 through 14 of series 11, there seems to be a transverse band of fluid or a wound within the volar subcutaneous tissues centered at the ring finger just proximal to the termination of the web spaces. This could represent an incipient abscess and measures 29 mm transverse by up to 3 mm proximal to distal by 3 mm anterior-posterior.  Some of the edema tracks along the flexor tendons of the middle, ring, and small fingers. No definite abnormal osseous edema or abnormal osseous enhancement.  IMPRESSION: 1. Abnormal subcutaneous edema and enhancement tracking in the medial hand from the level of the carpal bones up into the middle, index, and ring fingers compatible with cellulitis. There is also a small transverse band of nonenhancing fluid along the distal palm just below the termination of the web space centered at the ring finger, with well under 0.5 cc of volume and accordingly probably not a drainable abscess.   Electronically Signed   By: Ricardo BaltimoreWalt  Juarez M.D.   On: 05/02/2014 20:02   Dg Hand Complete Left  05/01/2014   CLINICAL DATA:  Pain, left hand injury at work in October 12, left hand blister and swelling  October 15  EXAM: LEFT HAND - COMPLETE 3+ VIEW  COMPARISON:  04/30/2014  FINDINGS: Three views of left hand submitted. No acute fracture or subluxation. No radiopaque foreign body.  IMPRESSION: Negative.   Electronically Signed   By: Natasha MeadLiviu  Pop M.D.   On: 05/01/2014 19:03    Disposition: 01-Home or Self Care      Discharge Instructions    Call MD / Call 911    Complete by:  As directed   If you experience chest pain or shortness of breath, CALL 911 and be transported to the hospital emergency room.  If you develope a fever above 101 F, pus (  white drainage) or increased drainage or redness at the wound, or calf pain, call your surgeon's office.     Constipation Prevention    Complete by:  As directed   Drink plenty of fluids.  Prune juice may be helpful.  You may use a stool softener, such as Colace (over the counter) 100 mg twice a day.  Use MiraLax (over the counter) for constipation as needed.     Diet - low sodium heart healthy    Complete by:  As directed      Discharge instructions    Complete by:  As directed   Elevate hand Keep splint dry Move fingers Return monday     Increase activity slowly as tolerated    Complete by:  As directed               Signed: Daffney Greenly SCOTT 05/15/2014, 12:37 PM

## 2014-10-21 ENCOUNTER — Emergency Department (HOSPITAL_COMMUNITY)
Admission: EM | Admit: 2014-10-21 | Discharge: 2014-10-21 | Disposition: A | Discharge status: Home / Self Care | Payer: 59 | Attending: Emergency Medicine | Admitting: Emergency Medicine

## 2014-10-21 ENCOUNTER — Encounter (HOSPITAL_COMMUNITY): Payer: Self-pay | Admitting: *Deleted

## 2014-10-21 DIAGNOSIS — Z880 Allergy status to penicillin: Secondary | ICD-10-CM | POA: Diagnosis not present

## 2014-10-21 DIAGNOSIS — Z8659 Personal history of other mental and behavioral disorders: Secondary | ICD-10-CM | POA: Diagnosis not present

## 2014-10-21 DIAGNOSIS — Z720 Tobacco use: Secondary | ICD-10-CM | POA: Insufficient documentation

## 2014-10-21 DIAGNOSIS — Z9889 Other specified postprocedural states: Secondary | ICD-10-CM | POA: Insufficient documentation

## 2014-10-21 DIAGNOSIS — K029 Dental caries, unspecified: Secondary | ICD-10-CM | POA: Diagnosis not present

## 2014-10-21 DIAGNOSIS — K088 Other specified disorders of teeth and supporting structures: Secondary | ICD-10-CM | POA: Insufficient documentation

## 2014-10-21 MED ORDER — CLINDAMYCIN HCL 150 MG PO CAPS: 300.0000 mg | ORAL_CAPSULE | Freq: Four times a day (QID) | ORAL | Status: DC

## 2014-10-21 MED ORDER — CLINDAMYCIN HCL 150 MG PO CAPS
300.0000 mg | ORAL_CAPSULE | Freq: Once | ORAL | Status: AC
  Administered 2014-10-21: 300 mg via ORAL
  Filled 2014-10-21: qty 2

## 2014-10-21 MED ORDER — IBUPROFEN 800 MG PO TABS: 800.0000 mg | ORAL_TABLET | Freq: Three times a day (TID) | ORAL | Status: DC

## 2014-10-21 NOTE — ED Provider Notes (Signed)
CSN: [id number]     Arrival date & time 10/21/14  2120 History   First MD Initiated Contact with Patient 10/21/14 2154     Chief Complaint  Patient presents with  . Dental Pain     (Consider location/radiation/quality/duration/timing/severity/associated sxs/prior Treatment) Patient is a 29 y.o. male presenting with tooth pain. The history is provided by the patient.  Dental Pain Location:  Lower Lower teeth location:  27/RL cuspid and 28/RL 1st bicuspid Quality:  Throbbing and constant Severity:  Severe Onset quality:  Gradual Duration:  1 day Timing:  Constant Progression:  Worsening Chronicity:  New Context: dental caries   Relieved by:  Nothing Worsened by:  Cold food/drink and pressure Ineffective treatments:  NSAIDs Associated symptoms: facial pain   Risk factors: smoking    Ricardo Juarez is a 29 y.o. male who presents to the ED with dental pain that has been going on x 1 day. He denies any other problems today.   Past Medical History  Diagnosis Date  . Anxiety     Panic attacks   Past Surgical History  Procedure Laterality Date  . Appendectomy    . Hernia repair    . Root canal    . I&d extremity Left 05/03/2014    Procedure: Irrigation and Debridement Left Hand;  Surgeon: Cammy Copa, MD;  Location: [facility] OR;  Service: Orthopedics;  Laterality: Left;   History reviewed. No pertinent family history. History  Substance Use Topics  . Smoking status: Current Every Day Smoker -- 1.00 packs/day    Types: Cigarettes  . Smokeless tobacco: Never Used  . Alcohol Use: No    Review of Systems  HENT: Positive for dental problem.   all other systems negative    Allergies  Amoxicillin and Penicillins  Home Medications   Prior to Admission medications   Medication Sig Start Date End Date Taking? Authorizing Provider  clindamycin (CLEOCIN) 150 MG capsule Take 2 capsules (300 mg total) by mouth every 6 (six) hours. 10/21/14   [place] Orlene Och, NP  doxycycline  (VIBRAMYCIN) 50 MG capsule Take 2 capsules (100 mg total) by mouth 2 (two) times daily. Patient not taking: Reported on 10/21/2014 05/04/14   Cammy Copa, MD  ibuprofen (ADVIL,MOTRIN) 800 MG tablet Take 1 tablet (800 mg total) by mouth 3 (three) times daily. 10/21/14   Cynthie Garmon Orlene Och, NP  oxyCODONE (OXY IR/ROXICODONE) 5 MG immediate release tablet Take 1-2 tablets (5-10 mg total) by mouth every 3 (three) hours as needed for breakthrough pain. Patient not taking: Reported on 10/21/2014 05/04/14   Cammy Copa, MD   BP 133/77 mmHg  Pulse 63  Temp(Src) 97.9 F (36.6 C) (Oral)  Resp 17  Ht  (1.727 m)  Wt 145 lb (65.772 kg)  BMI 22.05 kg/m2  SpO2 97% Physical Exam  Constitutional: He is oriented to person, place, and time. He appears well-developed and well-nourished. No distress.  HENT:  Head: Normocephalic.  Mouth/Throat: Uvula is midline, oropharynx is clear and moist and mucous membranes are normal.    Eyes: Conjunctivae and EOM are normal.  Neck: Neck supple.  Cardiovascular: Normal rate.   Pulmonary/Chest: Effort normal.  Abdominal: Soft. There is no tenderness.  Musculoskeletal: Normal range of motion.  Neurological: He is alert and oriented to person, place, and time. No cranial nerve deficit.  Skin: Skin is warm and dry.  Psychiatric: He has a normal mood and affect. His behavior is normal.  Nursing note and vitals reviewed.  ED Course  Procedures   MDM  29 y.o. male with multiple dental caries and pain. Stable for d/c without facial swelling or cellulitis. Will treat for infection and pain. Patient to follow up with a dentist as soon as possible. Clindamycin and ibuprofen Rx.   Final diagnoses:  Pain due to dental caries        Janne NapoleonHope M Quanah Majka, NP 10/22/14 0045  Gilda Creasehristopher J Pollina, MD 10/23/14 1515

## 2014-10-21 NOTE — Discharge Instructions (Signed)
Dental Caries Dental caries is tooth decay. This decay can cause a hole in teeth (cavity) that can get bigger and deeper over time. HOME CARE  Brush and floss your teeth. Do this at least two times a day.  Use a fluoride toothpaste.  Use a mouth rinse if told by your dentist or doctor.  Eat less sugary and starchy foods. Drink less sugary drinks.  Avoid snacking often on sugary and starchy foods. Avoid sipping often on sugary drinks.  Keep regular checkups and cleanings with your dentist.  Use fluoride supplements if told by your dentist or doctor.  Allow fluoride to be applied to teeth if told by your dentist or doctor. Document Released: 04/07/2008 Document Revised: 11/13/2013 Document Reviewed: 07/01/2012 [facility] Patient Information 2015 [place], [place]. This information is not intended to replace advice given to you by your health care provider. Make sure you discuss any questions you have with your health care provider.  Dental Pain Toothache is pain in or around a tooth. It may get worse with chewing or with cold or heat.  HOME CARE  Your dentist may use a numbing medicine during treatment. If so, you may need to avoid eating until the medicine wears off. Ask your dentist about this.  Only take medicine as told by your dentist or doctor.  Avoid chewing food near the painful tooth until after all treatment is done. Ask your dentist about this. GET HELP RIGHT AWAY IF:   The problem gets worse or new problems appear.  You have a fever.  There is redness and puffiness (swelling) of the face, jaw, or neck.  You cannot open your mouth.  There is pain in the jaw.  There is very bad pain that is not helped by medicine. MAKE SURE YOU:   Understand these instructions.  Will watch your condition.  Will get help right away if you are not doing well or get worse. Document Released: 12/16/2007 Document Revised: 09/21/2011 Document Reviewed: 12/16/2007 [facility] Patient  Information 2015 [place], [place]. This information is not intended to replace advice given to you by your health care provider. Make sure you discuss any questions you have with your health care provider.  Dental Care and Dentist Visits Dental care supports good overall health. Regular dental visits can also help you avoid dental pain, bleeding, infection, and other more serious health problems in the future. It is important to keep the mouth healthy because diseases in the teeth, gums, and other oral tissues can spread to other areas of the body. Some problems, such as diabetes, heart disease, and pre-term labor have been associated with poor oral health.  See your dentist every 6 months. If you experience emergency problems such as a toothache or broken tooth, go to the dentist right away. If you see your dentist regularly, you may catch problems early. It is easier to be treated for problems in the early stages.  WHAT TO EXPECT AT A DENTIST VISIT  Your dentist will look for many common oral health problems and recommend proper treatment. At your regular dental visit, you can expect:  Gentle cleaning of the teeth and gums. This includes scraping and polishing. This helps to remove the sticky substance around the teeth and gums (plaque). Plaque forms in the mouth shortly after eating. Over time, plaque hardens on the teeth as tartar. If tartar is not removed regularly, it can cause problems. Cleaning also helps remove stains.  Periodic X-rays. These pictures of the teeth and supporting bone  will help your dentist assess the health of your teeth. °· Periodic fluoride treatments. Fluoride is a natural mineral shown to help strengthen teeth. Fluoride treatment involves applying a fluoride gel or varnish to the teeth. It is most commonly done in children. °· Examination of the mouth, tongue, jaws, teeth, and gums to look for any oral health problems, such as: °¨ Cavities (dental caries). This is decay on the  tooth caused by plaque, sugar, and acid in the mouth. It is best to catch a cavity when it is small. °¨ Inflammation of the gums caused by plaque buildup (gingivitis). °¨ Problems with the mouth or malformed or misaligned teeth. °¨ Oral cancer or other diseases of the soft tissues or jaws.  °KEEP YOUR TEETH AND GUMS HEALTHY °For healthy teeth and gums, follow these general guidelines as well as your dentist's specific advice: °· Have your teeth professionally cleaned at the dentist every 6 months. °· Brush twice daily with a fluoride toothpaste. °· Floss your teeth daily.  °· Ask your dentist if you need fluoride supplements, treatments, or fluoride toothpaste. °· Eat a healthy diet. Reduce foods and drinks with added sugar. °· Avoid smoking. °TREATMENT FOR ORAL HEALTH PROBLEMS °If you have oral health problems, treatment varies depending on the conditions present in your teeth and gums. °· Your caregiver will most likely recommend good oral hygiene at each visit. °· For cavities, gingivitis, or other oral health disease, your caregiver will perform a procedure to treat the problem. This is typically done at a separate appointment. Sometimes your caregiver will refer you to another dental specialist for specific tooth problems or for surgery. °SEEK IMMEDIATE DENTAL CARE IF: °· You have pain, bleeding, or soreness in the gum, tooth, jaw, or mouth area. °· A permanent tooth becomes loose or separated from the gum socket. °· You experience a blow or injury to the mouth or jaw area. °Document Released: 03/11/2011 Document Revised: 09/21/2011 Document Reviewed: 03/11/2011 °[employer]® Patient Information ©2015 [employer]. This information is not intended to replace advice given to you by your health care provider. Make sure you discuss any questions you have with your health care provider. ° °

## 2014-10-21 NOTE — ED Notes (Signed)
Pt c/o left lower tooth pain x 1 day 

## 2015-02-20 ENCOUNTER — Emergency Department (HOSPITAL_COMMUNITY): Payer: 59

## 2015-02-20 ENCOUNTER — Emergency Department (HOSPITAL_COMMUNITY)
Admission: EM | Admit: 2015-02-20 | Discharge: 2015-02-20 | Disposition: A | Discharge status: Home / Self Care | Payer: 59 | Attending: Emergency Medicine | Admitting: Emergency Medicine

## 2015-02-20 ENCOUNTER — Encounter (HOSPITAL_COMMUNITY): Payer: Self-pay

## 2015-02-20 DIAGNOSIS — R0789 Other chest pain: Secondary | ICD-10-CM | POA: Diagnosis not present

## 2015-02-20 DIAGNOSIS — Z720 Tobacco use: Secondary | ICD-10-CM | POA: Insufficient documentation

## 2015-02-20 DIAGNOSIS — Z880 Allergy status to penicillin: Secondary | ICD-10-CM | POA: Insufficient documentation

## 2015-02-20 DIAGNOSIS — Z8659 Personal history of other mental and behavioral disorders: Secondary | ICD-10-CM | POA: Diagnosis not present

## 2015-02-20 DIAGNOSIS — R079 Chest pain, unspecified: Secondary | ICD-10-CM | POA: Diagnosis present

## 2015-02-20 LAB — CBC
HCT: 49.8 % (ref 39.0–52.0)
Hemoglobin: 17.4 g/dL — ABNORMAL HIGH (ref 13.0–17.0)
MCH: 31.7 pg (ref 26.0–34.0)
MCHC: 34.9 g/dL (ref 30.0–36.0)
MCV: 90.7 fL (ref 78.0–100.0)
Platelets: 196 10*3/uL (ref 150–400)
RBC: 5.49 MIL/uL (ref 4.22–5.81)
RDW: 12.6 % (ref 11.5–15.5)
WBC: 14.3 10*3/uL — ABNORMAL HIGH (ref 4.0–10.5)

## 2015-02-20 LAB — TROPONIN I

## 2015-02-20 LAB — BASIC METABOLIC PANEL
Anion gap: 6 (ref 5–15)
BUN: 13 mg/dL (ref 6–20)
CHLORIDE: 104 mmol/L (ref 101–111)
CO2: 30 mmol/L (ref 22–32)
Calcium: 8.8 mg/dL — ABNORMAL LOW (ref 8.9–10.3)
Creatinine, Ser: 1.05 mg/dL (ref 0.61–1.24)
GFR calc Af Amer: >60 mL/min (ref >60)
GFR calc non Af Amer: >60 mL/min (ref >60)
GLUCOSE: 92 mg/dL (ref 65–99)
Potassium: 4.1 mmol/L (ref 3.5–5.1)
Sodium: 140 mmol/L (ref 135–145)

## 2015-02-20 MED ORDER — NAPROXEN 500 MG PO TABS: 500.0000 mg | ORAL_TABLET | Freq: Two times a day (BID) | ORAL | Status: DC

## 2015-02-20 NOTE — Discharge Instructions (Signed)
Follow up with your md next week for recheck °

## 2015-02-20 NOTE — ED Notes (Signed)
Pt c/o intermittent chest pain for the past few days.  Denies sob, says gets nauseated associated with the pain at times.  Pt says also touching his chest makes it hurt.

## 2015-02-20 NOTE — ED Notes (Signed)
Pt and family updated on plan of care, no complaints noted at present time,
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
## 2015-02-20 NOTE — ED Notes (Signed)
Dr Zammit at bedside,  

## 2015-04-21 ENCOUNTER — Encounter (HOSPITAL_COMMUNITY): Payer: Self-pay | Admitting: Emergency Medicine

## 2015-04-21 ENCOUNTER — Emergency Department (HOSPITAL_COMMUNITY): Payer: 59

## 2015-04-21 ENCOUNTER — Emergency Department (HOSPITAL_COMMUNITY)
Admission: EM | Admit: 2015-04-21 | Discharge: 2015-04-21 | Disposition: A | Discharge status: Home / Self Care | Payer: 59 | Attending: Emergency Medicine | Admitting: Emergency Medicine

## 2015-04-21 DIAGNOSIS — J209 Acute bronchitis, unspecified: Secondary | ICD-10-CM | POA: Insufficient documentation

## 2015-04-21 DIAGNOSIS — J0100 Acute maxillary sinusitis, unspecified: Secondary | ICD-10-CM | POA: Diagnosis not present

## 2015-04-21 DIAGNOSIS — Z880 Allergy status to penicillin: Secondary | ICD-10-CM | POA: Insufficient documentation

## 2015-04-21 DIAGNOSIS — Z720 Tobacco use: Secondary | ICD-10-CM | POA: Insufficient documentation

## 2015-04-21 DIAGNOSIS — K0889 Other specified disorders of teeth and supporting structures: Secondary | ICD-10-CM | POA: Diagnosis not present

## 2015-04-21 DIAGNOSIS — R0981 Nasal congestion: Secondary | ICD-10-CM | POA: Diagnosis present

## 2015-04-21 DIAGNOSIS — Z8659 Personal history of other mental and behavioral disorders: Secondary | ICD-10-CM | POA: Diagnosis not present

## 2015-04-21 DIAGNOSIS — J40 Bronchitis, not specified as acute or chronic: Secondary | ICD-10-CM

## 2015-04-21 MED ORDER — DOXYCYCLINE HYCLATE 100 MG PO CAPS: 100.0000 mg | ORAL_CAPSULE | Freq: Two times a day (BID) | ORAL | Status: DC

## 2015-04-21 MED ORDER — FLUTICASONE PROPIONATE 50 MCG/ACT NA SUSP: 2.0000 | Freq: Every day | NASAL | Status: DC

## 2015-04-21 MED ORDER — DOXYCYCLINE HYCLATE 100 MG PO TABS
100.0000 mg | ORAL_TABLET | Freq: Once | ORAL | Status: AC
  Administered 2015-04-21: 100 mg via ORAL
  Filled 2015-04-21: qty 1

## 2015-04-21 MED ORDER — BENZONATATE 100 MG PO CAPS
200.0000 mg | ORAL_CAPSULE | Freq: Once | ORAL | Status: AC
  Administered 2015-04-21: 200 mg via ORAL
  Filled 2015-04-21: qty 2

## 2015-04-21 MED ORDER — BENZONATATE 100 MG PO CAPS: 200.0000 mg | ORAL_CAPSULE | Freq: Three times a day (TID) | ORAL | Status: DC | PRN

## 2015-04-21 MED ORDER — ALBUTEROL SULFATE HFA 108 (90 BASE) MCG/ACT IN AERS
2.0000 | INHALATION_SPRAY | Freq: Once | RESPIRATORY_TRACT | Status: AC
  Administered 2015-04-21: 2 via RESPIRATORY_TRACT
  Filled 2015-04-21: qty 6.7

## 2015-04-21 NOTE — ED Notes (Signed)
Pt states that he has had a cough and congestion since last Friday.

## 2015-04-21 NOTE — ED Provider Notes (Signed)
CSN: [id number]     Arrival date & time 04/21/15  1758 History  By signing my name below, I, Lyndel Safe, attest that this documentation has been prepared under the direction and in the presence of Burgess Amor, PA-C. Electronically Signed: Lyndel Safe, ED Scribe. 04/21/2015. 8:21 PM.   Chief Complaint  Patient presents with  . Cough   The history is provided by the patient. No language interpreter was used.   HPI Comments: Ricardo Juarez is a 29 y.o. male, with a PMhx of anxiety, who presents to the Emergency Department complaining of a gradually worsening cough that is productive with yellow sputum onset 9 days ago that has been unrelieved by OTC cold medication and rest. He reports once he begins to cough he is unable to stop. Pt states his cough is worse at night and he will wake from sleep in a diaphoretic states. He associates left-sided sinus pressure, left-sided dental aches, nasal congestion, a scratchy throat, and intermittent blood in sputum and blood in mucous when he blows his nose. He additionally reports intermittent SOB on exerion. Pt is a current daily smoker at 2 ppds. He is a Nutritional therapist by trade. No fevers. Denies PMhx of PNA. Allergy to amoxicillin and penicillin.    Past Medical History  Diagnosis Date  . Anxiety     Panic attacks   Past Surgical History  Procedure Laterality Date  . Appendectomy    . Hernia repair    . Root canal    . I&d extremity Left 05/03/2014    Procedure: Irrigation and Debridement Left Hand;  Surgeon: Cammy Copa, MD;  Location: [facility] OR;  Service: Orthopedics;  Laterality: Left;   History reviewed. No pertinent family history. Social History  Substance Use Topics  . Smoking status: Current Every Day Smoker -- 1.00 packs/day    Types: Cigarettes  . Smokeless tobacco: Never Used  . Alcohol Use: No    Review of Systems  Constitutional: Positive for diaphoresis. Negative for fever.  HENT: Positive for congestion, dental problem  (left side), rhinorrhea, sinus pressure ( left-sided ) and sore throat.   Respiratory: Positive for cough and shortness of breath.    Allergies  Amoxicillin and Penicillins  Home Medications   Prior to Admission medications   Medication Sig Start Date End Date Taking? Authorizing Provider  benzonatate (TESSALON) 100 MG capsule Take 2 capsules (200 mg total) by mouth 3 (three) times daily as needed for cough. 04/21/15   Burgess Amor, PA-C  doxycycline (VIBRAMYCIN) 100 MG capsule Take 1 capsule (100 mg total) by mouth 2 (two) times daily. 04/21/15   Burgess Amor, PA-C  fluticasone (FLONASE) 50 MCG/ACT nasal spray Place 2 sprays into both nostrils daily. 04/21/15   Burgess Amor, PA-C   BP 109/68 mmHg  Pulse 89  Temp(Src) 98.6 F (37 C) (Oral)  Ht  (1.727 m)  SpO2 99% Physical Exam  Constitutional: He is oriented to person, place, and time. He appears well-developed and well-nourished.  HENT:  Head: Normocephalic and atraumatic.  Right Ear: Tympanic membrane and ear canal normal.  Left Ear: Tympanic membrane and ear canal normal.  Nose: Mucosal edema and rhinorrhea present. Left sinus exhibits maxillary sinus tenderness.  Mouth/Throat: Uvula is midline, oropharynx is clear and moist and mucous membranes are normal. No oropharyngeal exudate, posterior oropharyngeal edema, posterior oropharyngeal erythema or tonsillar abscesses.  Eyes: Conjunctivae are normal.  Cardiovascular: Normal rate and normal heart sounds.   Pulmonary/Chest: Effort normal. No respiratory distress.  He has wheezes. He has no rales.  Sparse, scattered expiratory wheeze which clears with cough, rhonchi heard best at the mid to lower left lung fields.   Abdominal: Soft. There is no tenderness.  Musculoskeletal: Normal range of motion.  Neurological: He is alert and oriented to person, place, and time.  Skin: Skin is warm and dry. No rash noted.  Psychiatric: He has a normal mood and affect.    ED Course  Procedures   DIAGNOSTIC STUDIES: Oxygen Saturation is 99% on RA, normal by my interpretation.    COORDINATION OF CARE: 6:52 PM Discussed treatment plan with pt at bedside and pt agreed to plan. Will order chest Xray.   Imaging Review Dg Chest 2 View  04/21/2015   CLINICAL DATA:  29 year old male with mid left-sided chest pain during breathing, with some associated cough and congestion, as both 1 production. Shortness of breath for 1 week.  EXAM: CHEST  2 VIEW  COMPARISON:  Chest x-ray 02/20/2015.  FINDINGS: Lung volumes are normal. No consolidative airspace disease. No pleural effusions. No pneumothorax. No pulmonary nodule or mass noted. Pulmonary vasculature and the cardiomediastinal silhouette are within normal limits.  IMPRESSION: No radiographic evidence of acute cardiopulmonary disease.   Electronically Signed   By: Trudie Reed M.D.   On: 04/21/2015 19:47   I have personally reviewed and evaluated these images results as part of my medical decision-making.   MDM   Final diagnoses:  Acute maxillary sinusitis, recurrence not specified  Bronchitis      Radiological studies were viewed, interpreted and considered during the medical decision making and disposition process. I agree with radiologists reading.  Results were also discussed with patient.   Pt placed on doxycycline for sinusitis given pcn allergy.  Tessalon for cough, flonase for nasal decongestant, also given an albuterol mdi with spacer with instructions for use.  Advised smoking cessation and was given info for assistance with this. Advised f/u with his pcp for a recheck if sx persist or worsen.  I personally performed the services described in this documentation, which was scribed in my presence. The recorded information has been reviewed and is accurate.    Burgess Amor, PA-C 04/23/15 1357  Rolland Porter, MD 04/24/15 [phone]

## 2015-04-21 NOTE — Discharge Instructions (Signed)
Sinusitis, Adult Sinusitis is redness, soreness, and puffiness (inflammation) of the air pockets in the bones of your face (sinuses). The redness, soreness, and puffiness can cause air and mucus to get trapped in your sinuses. This can allow germs to grow and cause an infection.  HOME CARE  1. Drink enough fluids to keep your pee (urine) clear or pale yellow. 2. Use a humidifier in your home. 3. Run a hot shower to create steam in the bathroom. Sit in the bathroom with the door closed. Breathe in the steam 3-4 times a day. 4. Put a warm, moist washcloth on your face 3-4 times a day, or as told by your doctor. 5. Use salt water sprays (saline sprays) to wet the thick fluid in your nose. This can help the sinuses drain. 6. Only take medicine as told by your doctor. GET HELP RIGHT AWAY IF:   Your pain gets worse.  You have very bad headaches.  You are sick to your stomach (nauseous).  You throw up (vomit).  You are very sleepy (drowsy) all the time.  Your face is puffy (swollen).  Your vision changes.  You have a stiff neck.  You have trouble breathing. MAKE SURE YOU:   Understand these instructions.  Will watch your condition.  Will get help right away if you are not doing well or get worse.   This information is not intended to replace advice given to you by your health care provider. Make sure you discuss any questions you have with your health care provider.   Document Released: 12/16/2007 Document Revised: 07/20/2014 Document Reviewed: 02/02/2012 Elsevier Interactive Patient Education 2016 Elsevier Inc.  Metered Dose Inhaler (No Spacer Used) Inhaled medicines are the basis of treatment for asthma and other breathing problems. Inhaled medicine can only be effective if used properly. Good technique assures that the medicine reaches the lungs. Metered dose inhalers (MDIs) are used to deliver a variety of inhaled medicines. These include quick relief or rescue medicines  (such as bronchodilators) and controller medicines (such as corticosteroids). The medicine is delivered by pushing down on a metal canister to release a set amount of spray. If you are using different kinds of inhalers, use your quick relief medicine to open the airways 10-15 minutes before using a steroid, if instructed to do so by your health care provider. If you are unsure which inhalers to use and the order of using them, ask your health care provider, nurse, or respiratory therapist. HOW TO USE THE INHALER 7. Remove the cap from the inhaler. 8. If you are using the inhaler for the first time, you will need to prime it. Shake the inhaler for 5 seconds and release four puffs into the air, away from your face. Ask your health care provider or pharmacist if you have questions about priming your inhaler. 9. Shake the inhaler for 5 seconds before each breath in (inhalation). 10. Position the inhaler so that the top of the canister faces up. 11. Put your index finger on the top of the medicine canister. Your thumb supports the bottom of the inhaler. 12. Open your mouth. 13. Either place the inhaler between your teeth and place your lips tightly around the mouthpiece, or hold the inhaler 1-2 inches away from your open mouth. If you are unsure of which technique to use, ask your health care provider. 14. Breathe out (exhale) normally and as completely as possible. 15. Press the canister down with the index finger to release the medicine. 16.  At the same time as the canister is pressed, inhale deeply and slowly until your lungs are completely filled. This should take 4-6 seconds. Keep your tongue down. 17. Hold the medicine in your lungs for 5-10 seconds (10 seconds is best). This helps the medicine get into the small airways of your lungs. 18. Breathe out slowly, through pursed lips. Whistling is an example of pursed lips. 19. Wait at least 1 minute between puffs. Continue with the above steps until you  have taken the number of puffs your health care provider has ordered. Do not use the inhaler more than your health care provider directs you to. 20. Replace the cap on the inhaler. 21. Follow the directions from your health care provider or the inhaler insert for cleaning the inhaler. If you are using a steroid inhaler, after your last puff, rinse your mouth with water, gargle, and spit out the water. Do not swallow the water. AVOID:  Inhaling before or after starting the spray of medicine. It takes practice to coordinate your breathing with triggering the spray.  Inhaling through the nose (rather than the mouth) when triggering the spray. HOW TO DETERMINE IF YOUR INHALER IS FULL OR NEARLY EMPTY You cannot know when an inhaler is empty by shaking it. Some inhalers are now being made with dose counters. Ask your health care provider for a prescription that has a dose counter if you feel you need that extra help. If your inhaler does not have a counter, ask your health care provider to help you determine the date you need to refill your inhaler. Write the refill date on a calendar or your inhaler canister. Refill your inhaler 7-10 days before it runs out. Be sure to keep an adequate supply of medicine. This includes making sure it has not expired, and making sure you have a spare inhaler. SEEK MEDICAL CARE IF:  Symptoms are only partially relieved with your inhaler.  You are having trouble using your inhaler.  You experience an increase in phlegm. SEEK IMMEDIATE MEDICAL CARE IF:  You feel little or no relief with your inhalers. You are still wheezing and feeling shortness of breath, tightness in your chest, or both.  You have dizziness, headaches, or a fast heart rate.  You have chills, fever, or night sweats.  There is a noticeable increase in phlegm production, or there is blood in the phlegm. MAKE SURE YOU:  Understand these instructions.  Will watch your condition.  Will get help  right away if you are not doing well or get worse.   This information is not intended to replace advice given to you by your health care provider. Make sure you discuss any questions you have with your health care provider.   Document Released: 04/26/2007 Document Revised: 07/20/2014 Document Reviewed: 12/15/2012 Elsevier Interactive Patient Education [employer]

## 2015-07-14 DIAGNOSIS — Z9889 Other specified postprocedural states: Secondary | ICD-10-CM

## 2015-07-14 HISTORY — DX: Other specified postprocedural states: Z98.890

## 2016-01-22 ENCOUNTER — Emergency Department (HOSPITAL_COMMUNITY)
Admission: EM | Admit: 2016-01-22 | Discharge: 2016-01-23 | Disposition: A | Discharge status: Home / Self Care | Payer: Self-pay | Attending: Emergency Medicine | Admitting: Emergency Medicine

## 2016-01-22 ENCOUNTER — Encounter (HOSPITAL_COMMUNITY): Payer: Self-pay | Admitting: *Deleted

## 2016-01-22 DIAGNOSIS — T675XXA Heat exhaustion, unspecified, initial encounter: Secondary | ICD-10-CM | POA: Insufficient documentation

## 2016-01-22 DIAGNOSIS — R110 Nausea: Secondary | ICD-10-CM | POA: Insufficient documentation

## 2016-01-22 DIAGNOSIS — R05 Cough: Secondary | ICD-10-CM | POA: Insufficient documentation

## 2016-01-22 DIAGNOSIS — F17210 Nicotine dependence, cigarettes, uncomplicated: Secondary | ICD-10-CM | POA: Insufficient documentation

## 2016-01-22 DIAGNOSIS — R509 Fever, unspecified: Secondary | ICD-10-CM | POA: Insufficient documentation

## 2016-01-22 DIAGNOSIS — M549 Dorsalgia, unspecified: Secondary | ICD-10-CM | POA: Insufficient documentation

## 2016-01-22 MED ORDER — SODIUM CHLORIDE 0.9 % IV BOLUS (SEPSIS)
1000.0000 mL | Freq: Once | INTRAVENOUS | Status: AC
  Administered 2016-01-23: 1000 mL via INTRAVENOUS

## 2016-01-22 NOTE — ED Provider Notes (Signed)
CSN: [id number]     Arrival date & time 01/22/16  2215 History  By signing my name below, I, Emmanuella Mensah, attest that this documentation has been prepared under the direction and in the presence of Shon Batonourtney F Zafira Munos, MD. Electronically Signed: Angelene GiovanniEmmanuella Mensah, ED Scribe. 01/22/2016. 11:47 PM.      Chief Complaint  Patient presents with  . Dizziness   Patient is a 30 y.o. male presenting with dizziness. The history is provided by the patient. No language interpreter was used.  Dizziness Quality:  Lightheadedness Severity:  Moderate Onset quality:  Sudden Timing:  Intermittent Progression:  Resolved Chronicity:  New Relieved by:  None tried Worsened by:  Nothing Ineffective treatments:  None tried Associated symptoms: headaches and nausea   Associated symptoms: no chest pain, no diarrhea, no shortness of breath and no vomiting    HPI Comments: Izaan R Cresenciano Genreruitt is a 30 y.o. male who presents to the Emergency Department complaining of an episode of  lightheadedness, headache, and a fever of 100.9 that occurred earlier today. Patient reports that he was out in the sun and heat at work and felt lightheaded and dizzy. He also reports nausea. He reports a chronic nonproductive cough. He is a smoker. He reports diffuse myalgias He states that he works out in the sun for a long period of time. He denies any exposure to ticks but states that he has been intermittently being bitten by an unknown insect. No alleviating factors noted. Pt has not tried any medications PTA. He denies any sick contacts. Pt is a current smoker but denies any ETOH use. He denies any burning with urination, vomiting, diarrhea, chest pain, SOB, or rash.    Past Medical History  Diagnosis Date  . Anxiety     Panic attacks   Past Surgical History  Procedure Laterality Date  . Appendectomy    . Hernia repair    . Root canal    . I&d extremity Left 05/03/2014    Procedure: Irrigation and Debridement Left Hand;   Surgeon: Cammy CopaGregory Scott Dean, MD;  [facility];  Service: Orthopedics;  Laterality: Left;   History reviewed. No pertinent family history. Social History  Substance Use Topics  . Smoking status: Current Every Day Smoker -- 1.00 packs/day    Types: Cigarettes  . Smokeless tobacco: Never Used  . Alcohol Use: No   Review of Systems  Constitutional: Positive for fever.  Respiratory: Positive for cough. Negative for shortness of breath.   Cardiovascular: Negative for chest pain.  Gastrointestinal: Positive for nausea. Negative for vomiting and diarrhea.  Genitourinary: Positive for frequency.  Musculoskeletal: Positive for back pain.  Neurological: Positive for dizziness and headaches. Negative for syncope.  All other systems reviewed and are negative.     Allergies  Amoxicillin and Penicillins  Home Medications   Prior to Admission medications   Not on File   BP 105/61 mmHg  Pulse 72  Temp(Src) 98.7 F (37.1 C) (Oral)  Resp 19  Ht 5\' 8"  (1.727 m)  Wt 150 lb (68.04 kg)  BMI 22.81 kg/m2  SpO2 95% Physical Exam  Constitutional: He is oriented to person, place, and time. He appears well-developed and well-nourished.  HENT:  Head: Normocephalic and atraumatic.  Mucous membranes dry  Eyes: Pupils are equal, round, and reactive to light.  Cardiovascular: Normal rate, regular rhythm and normal heart sounds.   No murmur heard. Pulmonary/Chest: Effort normal and breath sounds normal. No respiratory distress. He has no wheezes.  Abdominal: Soft. Bowel sounds are normal. There is no tenderness. There is no rebound.  Musculoskeletal: He exhibits no edema.  Neurological: He is alert and oriented to person, place, and time.  Skin: Skin is warm and dry.  No rash  Psychiatric: He has a normal mood and affect.  Nursing note and vitals reviewed.   ED Course  Procedures (including critical care time) DIAGNOSTIC STUDIES: Oxygen Saturation is 100% on RA, normal by my  interpretation.    COORDINATION OF CARE: 11:45 PM- Pt advised of plan for treatment and pt agrees.    Labs Review Labs Reviewed  COMPREHENSIVE METABOLIC PANEL - Abnormal; Notable for the following:    Potassium 3.4 (*)    Glucose, Bld 105 (*)    Calcium 8.7 (*)    ALT 12 (*)    All other components within normal limits  CBC WITH DIFFERENTIAL/PLATELET  URINALYSIS, ROUTINE W REFLEX MICROSCOPIC (NOT AT [facility])  CK    Imaging Review No results found.   Shon Baton, MD has personally reviewed and evaluated these images and lab results as part of her medical decision-making.   EKG Interpretation   Date/Time:  Wednesday January 22 2016 23:53:43 EDT Ventricular Rate:  79 PR Interval:    QRS Duration: 84 QT Interval:  347 QTC Calculation: 398 R Axis:   88 Text Interpretation:  Sinus rhythm Probable anteroseptal infarct, old No  significant change since last tracing Confirmed by Shirlena Brinegar  MD, Cephas Revard  [phone]) on 01/23/2016 12:15:15 AM      MDM   Final diagnoses:  Heat exhaustion, initial encounter    Patient presents with an episode of dizziness and lightheadedness while out in the sun at work. Reports fever to 100.9. Currently afebrile and vital signs reassuring. Physical exam is largely reassuring. He does appear dry. Patient was given fluids. EKG shows no evidence of arrhythmia. Orthostatics are negative. CK is normal. Basic labwork is reassuring. Suspect heat exhaustion. Discuss with patient aggressive hydration and taking breaks from the heat while at work. On recheck, patient reports improvement with hydration.  After history, exam, and medical workup I feel the patient has been appropriately medically screened and is safe for discharge home. Pertinent diagnoses were discussed with the patient. Patient was given return precautions.  I personally performed the services described in this documentation, which was scribed in my presence. The recorded information has been  reviewed and is accurate.    Shon Baton, MD 01/23/16 [phone]

## 2016-01-22 NOTE — ED Notes (Signed)
Pt states he was out in heat today and started having some dizziness; pt states now he has a headache, chest pain and leg pain; pt states he was drinking water and gatorade while out in sun

## 2016-01-23 LAB — COMPREHENSIVE METABOLIC PANEL
ALBUMIN: 4.1 g/dL (ref 3.5–5.0)
ALT: 12 U/L — ABNORMAL LOW (ref 17–63)
ANION GAP: 5 (ref 5–15)
AST: 17 U/L (ref 15–41)
Alkaline Phosphatase: 64 U/L (ref 38–126)
BILIRUBIN TOTAL: 0.4 mg/dL (ref 0.3–1.2)
BUN: 15 mg/dL (ref 6–20)
CO2: 27 mmol/L (ref 22–32)
Calcium: 8.7 mg/dL — ABNORMAL LOW (ref 8.9–10.3)
Chloride: 104 mmol/L (ref 101–111)
Creatinine, Ser: 1 mg/dL (ref 0.61–1.24)
GFR calc Af Amer: >60 mL/min (ref >60)
GFR calc non Af Amer: >60 mL/min (ref >60)
Glucose, Bld: 105 mg/dL — ABNORMAL HIGH (ref 65–99)
POTASSIUM: 3.4 mmol/L — AB (ref 3.5–5.1)
Sodium: 136 mmol/L (ref 135–145)
TOTAL PROTEIN: 6.6 g/dL (ref 6.5–8.1)

## 2016-01-23 LAB — CBC WITH DIFFERENTIAL/PLATELET
BASOS PCT: 0 %
Basophils Absolute: 0 10*3/uL (ref 0.0–0.1)
EOS ABS: 0.2 10*3/uL (ref 0.0–0.7)
EOS PCT: 2 %
HCT: 46.2 % (ref 39.0–52.0)
Hemoglobin: 16.4 g/dL (ref 13.0–17.0)
Lymphocytes Relative: 15 %
Lymphs Abs: 1.6 10*3/uL (ref 0.7–4.0)
MCH: 31.8 pg (ref 26.0–34.0)
MCHC: 35.5 g/dL (ref 30.0–36.0)
MCV: 89.7 fL (ref 78.0–100.0)
MONO ABS: 0.8 10*3/uL (ref 0.1–1.0)
MONOS PCT: 8 %
Neutro Abs: 7.6 10*3/uL (ref 1.7–7.7)
Neutrophils Relative %: 75 %
PLATELETS: 195 10*3/uL (ref 150–400)
RBC: 5.15 MIL/uL (ref 4.22–5.81)
RDW: 12.7 % (ref 11.5–15.5)
WBC: 10.2 10*3/uL (ref 4.0–10.5)

## 2016-01-23 LAB — URINALYSIS, ROUTINE W REFLEX MICROSCOPIC
Bilirubin Urine: NEGATIVE
Glucose, UA: NEGATIVE mg/dL
Hgb urine dipstick: NEGATIVE
Ketones, ur: NEGATIVE mg/dL
LEUKOCYTES UA: NEGATIVE
Nitrite: NEGATIVE
PROTEIN: NEGATIVE mg/dL
Specific Gravity, Urine: 1.02 (ref 1.005–1.030)
pH: 6 (ref 5.0–8.0)

## 2016-01-23 LAB — CK: Total CK: 117 U/L (ref 49–397)

## 2016-01-23 NOTE — Discharge Instructions (Signed)
Heat Exhaustion Information WHAT IS HEAT EXHAUSTION? Heat exhaustion happens when your body gets overheated from hot weather or from exercise. Heat exhaustion makes the temperature of your skin and body go up. Your body cools itself by sweating. If you do not drink enough water to replace what you sweat, you lose too much water and salt. This makes it harder for your body to produce more sweat. When you do not sweat enough, your body cannot cool down, and heat exhaustion may result. Heat exhaustion can lead to heatstroke, which is a more serious illness. WHO IS AT RISK FOR HEAT EXHAUSTION? Anyone can get heat exhaustion. However, heat exhaustion is more likely when you are exercising or doing a physical activity. It is also more likely when you are in hot and humid weather or bright sunshine. Heat exhaustion is also more likely to develop in:  People who are age 65 or older.  Children.  People who have a medical condition such as heart disease, poor circulation, sickle cell disease, or high blood pressure.  People who have a fever.  People who are very overweight (obese).  People who are dehydrated from:  Drinking alcohol or caffeine.  Taking certain medicines, such as diuretics or stimulants. WHAT ARE THE SYMPTOMS OF HEAT EXHAUSTION? Symptoms of heat exhaustion include:  A body temperature of up to 104F (40C).  Moist, cool, and clammy skin.  Dizziness.  Headache.  Nausea.  Fatigue.  Thirst.  Dark-colored urine.  Rapid pulse or heartbeat.  Weakness.  Muscle cramps.  Confusion.  Fainting. WHAT SHOULD I DO IF I THINK I HAVE HEAT EXHAUSTION? If you think that you have heat exhaustion, call your health care provider. Follow his or her instructions. You should also:  Call a friend or a family member and ask someone to stay with you.  Move to a cooler location, such as:  Into the shade.  In front of a fan.  Someplace that has air conditioning.  Lie down  and rest.  Slowly drink nonalcoholic, caffeine-free fluids.  Take off any extra clothing or tight-fitting clothes.  Take a cool bath or shower, if possible. If you do not have access to a bath or shower, dab or mist cool water on your skin. WHY IS IT IMPORTANT TO TREAT HEAT EXHAUSTION? It is extremely important to take care of yourself and treat heat exhaustion as soon as possible. Untreated heat exhaustion can turn into heatstroke. Symptoms of heatstroke include:  A body temperature of 104F (40C) or higher.  Hot, red skin that may be dry or moist.  Severe headache.  Nausea and vomiting.  Muscle weakness and cramping.  Confusion.  Rapid breathing.  Fainting.  Seizure. These symptoms may represent a serious problem that is an emergency. Do not wait to see if the symptoms will go away. Get medical help right away. Call your local emergency services (911 in the [place] Do not drive yourself to the hospital. Heatstroke is a life-threatening condition that requires urgent medical treatment. Do not treat heatstroke at home. Heatstroke should be treated by a health care professional and may require hospitalization. At the hospital, you may need to receive fluids through an IV tube:  If you cannot drink any fluids.  If you vomit any fluids that you drink.  If your symptoms do not get better after one hour.  If your symptoms get worse after one hour. HOW CAN I PREVENT HEAT EXHAUSTION?  Avoid outdoor activities on very hot or humid days.    Do not exercise or do other physical activity when you are not feeling well.  Drink plenty of nonalcoholic and caffeine-free fluids before and during physical activity.  Take frequent breaks for rest during physical activity.  Wear light-colored, loose-fitting, and lightweight clothing in the heat.  Wear a hat and use sunscreen when exercising outdoors.  Avoid being outside during the hottest times of the day.  Check with your health  care provider before you start any new activity, especially if you take medicine or have a medical condition.  Start any new activity slowly and work up to your fitness level. HOW CAN I HELP TO PROTECT ELDERLY RELATIVES AND NEIGHBORS FROM HEAT EXHAUSTION? People who are age 65 or older are at greater risk for heat exhaustion. Their bodies have a harder time adjusting to heat. They are also more likely to have a medical condition or be on medicines that increase their risk for heat exhaustion. They may get heat exhaustion indoors if the heat is high for several days. You can help to protect them during hot weather by:  Checking on them two or more times each day.  Making sure that they are drinking plenty of cool, nonalcoholic, and caffeine-free fluids.  Making sure that they use their air conditioner.  Taking them to a location where air conditioning is available.  Talking with their health care provider about their medical needs, medicines, and fluid requirements.   This information is not intended to replace advice given to you by your health care provider. Make sure you discuss any questions you have with your health care provider.   Document Released: 04/07/2008 Document Revised: 03/20/2015 Document Reviewed: 06/06/2014 Elsevier Interactive Patient Education 2016 Elsevier Inc.  

## 2016-05-05 ENCOUNTER — Encounter (HOSPITAL_COMMUNITY): Payer: Self-pay | Admitting: Emergency Medicine

## 2016-05-05 ENCOUNTER — Emergency Department (HOSPITAL_COMMUNITY): Payer: Self-pay

## 2016-05-05 ENCOUNTER — Emergency Department (HOSPITAL_COMMUNITY)
Admission: EM | Admit: 2016-05-05 | Discharge: 2016-05-05 | Disposition: A | Discharge status: Home / Self Care | Payer: Self-pay | Attending: Emergency Medicine | Admitting: Emergency Medicine

## 2016-05-05 DIAGNOSIS — Z792 Long term (current) use of antibiotics: Secondary | ICD-10-CM | POA: Insufficient documentation

## 2016-05-05 DIAGNOSIS — F17210 Nicotine dependence, cigarettes, uncomplicated: Secondary | ICD-10-CM | POA: Insufficient documentation

## 2016-05-05 DIAGNOSIS — J209 Acute bronchitis, unspecified: Secondary | ICD-10-CM | POA: Insufficient documentation

## 2016-05-05 MED ORDER — AZITHROMYCIN 250 MG PO TABS: 250.0000 mg | ORAL_TABLET | Freq: Every day | ORAL | 0 refills | Status: DC

## 2016-05-05 MED ORDER — ALBUTEROL SULFATE HFA 108 (90 BASE) MCG/ACT IN AERS: 1.0000 | INHALATION_SPRAY | Freq: Four times a day (QID) | RESPIRATORY_TRACT | 0 refills | Status: DC | PRN

## 2016-05-05 NOTE — ED Provider Notes (Signed)
[facility]-EMERGENCY DEPT Provider Note   CSN: [id number] Arrival date & time: 05/05/16  2000     History   Chief Complaint Chief Complaint  Patient presents with  . Cough    HPI Ricardo Juarez is a 30 y.o. male.  The history is provided by the patient. No language interpreter was used.  Cough  This is a new problem. The problem occurs constantly. The cough is non-productive. There has been no fever. Pertinent negatives include no chest pain. He has tried nothing for the symptoms. He is not a smoker. His past medical history does not include pneumonia.  Pt complains of a cough.  Pt reports his Mother has pneumonia currently.  Pt concerned he could be getting pneumonia  Past Medical History:  Diagnosis Date  . Anxiety    Panic attacks    Patient Active Problem List   Diagnosis Date Noted  . Abrasion, hand with infection 05/03/2014    Past Surgical History:  Procedure Laterality Date  . APPENDECTOMY    . HERNIA REPAIR    . I&D EXTREMITY Left 05/03/2014   Procedure: Irrigation and Debridement Left Hand;  Surgeon: Cammy Copa, MD;  Location: [facility] OR;  Service: Orthopedics;  Laterality: Left;  . ROOT CANAL         Home Medications    Prior to Admission medications   Medication Sig Start Date End Date Taking? Authorizing Provider  albuterol (PROVENTIL HFA;VENTOLIN HFA) 108 (90 Base) MCG/ACT inhaler Inhale 1-2 puffs into the lungs every 6 (six) hours as needed for wheezing or shortness of breath. 05/05/16   Elson Areas, PA-C  azithromycin (ZITHROMAX) 250 MG tablet Take 1 tablet (250 mg total) by mouth daily. Take first 2 tablets together, then 1 every day until finished. 05/05/16   Elson Areas, PA-C    Family History History reviewed. No pertinent family history.  Social History Social History  Substance Use Topics  . Smoking status: Current Every Day Smoker    Packs/day: 1.50    Types: Cigarettes  . Smokeless tobacco: Never Used  . Alcohol use No      Allergies   Amoxicillin and Penicillins   Review of Systems Review of Systems  Respiratory: Positive for cough.   Cardiovascular: Negative for chest pain.  All other systems reviewed and are negative.    Physical Exam Updated Vital Signs BP 128/69 (BP Location: Left Arm)   Pulse 68   Temp 98 F (36.7 C) (Oral)   Resp 16   Ht 5\' 8"  (1.727 m)   Wt 68 kg   SpO2 96%   BMI 22.81 kg/m   Physical Exam  Constitutional: He appears well-developed and well-nourished.  HENT:  Head: Normocephalic and atraumatic.  Eyes: Conjunctivae are normal.  Neck: Neck supple.  Cardiovascular: Normal rate and regular rhythm.   No murmur heard. Pulmonary/Chest: Effort normal and breath sounds normal. No respiratory distress.  rhonchi  Abdominal: Soft. There is no tenderness.  Musculoskeletal: He exhibits no edema.  Neurological: He is alert.  Skin: Skin is warm and dry.  Psychiatric: He has a normal mood and affect.  Nursing note and vitals reviewed.    ED Treatments / Results  Labs (all labs ordered are listed, but only abnormal results are displayed) Labs Reviewed - No data to display  EKG  EKG Interpretation None       Radiology Dg Chest 2 View  Result Date: 05/05/2016 CLINICAL DATA:  Cough, congestion and sinus pressure starting yesterday  EXAM: CHEST  2 VIEW COMPARISON:  04/21/2015 FINDINGS: The heart size and mediastinal contours are within normal limits. Both lungs are clear. The visualized skeletal structures are unremarkable. IMPRESSION: No active cardiopulmonary disease. Electronically Signed   By: Tollie Ethavid  Kwon M.D.   On: 05/05/2016 21:31    Procedures Procedures (including critical care time)  Medications Ordered in ED Medications - No data to display   Initial Impression / Assessment and Plan / ED Course  I have reviewed the triage vital signs and the nursing notes.  Pertinent labs & imaging results that were available during my care of the patient were  reviewed by me and considered in my medical decision making (see chart for details).  Clinical Course      Final Clinical Impressions(s) / ED Diagnoses   Final diagnoses:  Acute bronchitis, unspecified organism    New Prescriptions New Prescriptions   ALBUTEROL (PROVENTIL HFA;VENTOLIN HFA) 108 (90 BASE) MCG/ACT INHALER    Inhale 1-2 puffs into the lungs every 6 (six) hours as needed for wheezing or shortness of breath.   AZITHROMYCIN (ZITHROMAX) 250 MG TABLET    Take 1 tablet (250 mg total) by mouth daily. Take first 2 tablets together, then 1 every day until finished.  An After Visit Summary was printed and given to the patient.   Elson AreasLeslie K Schyler Counsell, PA-C 05/05/16 2146    Elson AreasLeslie K [place] Mullett, PA-C 05/05/16 2146    Canary Brimhristopher J Tegeler, MD 05/06/16 1330

## 2016-05-05 NOTE — Discharge Instructions (Signed)
See your Physician for recheck in 1 week  °

## 2016-05-05 NOTE — ED Triage Notes (Signed)
Pt reports cough, chest congestion and sinus pressure that started yesterday.

## 2016-12-25 ENCOUNTER — Encounter (HOSPITAL_COMMUNITY): Payer: Self-pay | Admitting: Emergency Medicine

## 2016-12-25 ENCOUNTER — Emergency Department (HOSPITAL_COMMUNITY): Payer: No Typology Code available for payment source

## 2016-12-25 ENCOUNTER — Emergency Department (HOSPITAL_COMMUNITY)
Admission: EM | Admit: 2016-12-25 | Discharge: 2016-12-25 | Disposition: A | Discharge status: Home / Self Care | Payer: No Typology Code available for payment source | Attending: Emergency Medicine | Admitting: Emergency Medicine

## 2016-12-25 DIAGNOSIS — Y999 Unspecified external cause status: Secondary | ICD-10-CM | POA: Diagnosis not present

## 2016-12-25 DIAGNOSIS — Y939 Activity, unspecified: Secondary | ICD-10-CM | POA: Insufficient documentation

## 2016-12-25 DIAGNOSIS — Y92410 Unspecified street and highway as the place of occurrence of the external cause: Secondary | ICD-10-CM | POA: Diagnosis not present

## 2016-12-25 DIAGNOSIS — S39012A Strain of muscle, fascia and tendon of lower back, initial encounter: Secondary | ICD-10-CM | POA: Insufficient documentation

## 2016-12-25 DIAGNOSIS — S3992XA Unspecified injury of lower back, initial encounter: Secondary | ICD-10-CM | POA: Diagnosis present

## 2016-12-25 DIAGNOSIS — Z79899 Other long term (current) drug therapy: Secondary | ICD-10-CM | POA: Insufficient documentation

## 2016-12-25 DIAGNOSIS — F17210 Nicotine dependence, cigarettes, uncomplicated: Secondary | ICD-10-CM | POA: Insufficient documentation

## 2016-12-25 LAB — URINALYSIS, ROUTINE W REFLEX MICROSCOPIC
BILIRUBIN URINE: NEGATIVE
Glucose, UA: NEGATIVE mg/dL
Hgb urine dipstick: NEGATIVE
Ketones, ur: NEGATIVE mg/dL
Leukocytes, UA: NEGATIVE
Nitrite: NEGATIVE
PROTEIN: NEGATIVE mg/dL
Specific Gravity, Urine: 1.003 — ABNORMAL LOW (ref 1.005–1.030)
pH: 8 (ref 5.0–8.0)

## 2016-12-25 MED ORDER — IBUPROFEN 800 MG PO TABS: 800.0000 mg | ORAL_TABLET | Freq: Three times a day (TID) | ORAL | 0 refills | Status: DC

## 2016-12-25 MED ORDER — HYDROCODONE-ACETAMINOPHEN 5-325 MG PO TABS: ORAL_TABLET | ORAL | 0 refills | Status: DC

## 2016-12-25 MED ORDER — CYCLOBENZAPRINE HCL 10 MG PO TABS: 10.0000 mg | ORAL_TABLET | Freq: Three times a day (TID) | ORAL | 0 refills | Status: DC | PRN

## 2016-12-25 NOTE — Discharge Instructions (Signed)
Apply ice packs on/off to your back.  Avoid twisting, bending over or lifting greater than 20 pounds for at least one week.  Return to ER for any worsening symptoms
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
## 2017-04-15 ENCOUNTER — Emergency Department (HOSPITAL_COMMUNITY): Payer: Self-pay

## 2017-04-15 ENCOUNTER — Encounter (HOSPITAL_COMMUNITY): Payer: Self-pay | Admitting: Emergency Medicine

## 2017-04-15 ENCOUNTER — Emergency Department (HOSPITAL_COMMUNITY)
Admission: EM | Admit: 2017-04-15 | Discharge: 2017-04-15 | Disposition: A | Discharge status: Home / Self Care | Payer: Self-pay | Attending: Emergency Medicine | Admitting: Emergency Medicine

## 2017-04-15 DIAGNOSIS — Z79899 Other long term (current) drug therapy: Secondary | ICD-10-CM | POA: Insufficient documentation

## 2017-04-15 DIAGNOSIS — Z880 Allergy status to penicillin: Secondary | ICD-10-CM | POA: Insufficient documentation

## 2017-04-15 DIAGNOSIS — J40 Bronchitis, not specified as acute or chronic: Secondary | ICD-10-CM | POA: Insufficient documentation

## 2017-04-15 DIAGNOSIS — J029 Acute pharyngitis, unspecified: Secondary | ICD-10-CM | POA: Insufficient documentation

## 2017-04-15 DIAGNOSIS — F17210 Nicotine dependence, cigarettes, uncomplicated: Secondary | ICD-10-CM | POA: Insufficient documentation

## 2017-04-15 DIAGNOSIS — R0981 Nasal congestion: Secondary | ICD-10-CM | POA: Insufficient documentation

## 2017-04-15 LAB — RAPID STREP SCREEN (MED CTR MEBANE ONLY): Streptococcus, Group A Screen (Direct): NEGATIVE

## 2017-04-15 MED ORDER — ALBUTEROL SULFATE HFA 108 (90 BASE) MCG/ACT IN AERS
2.0000 | INHALATION_SPRAY | Freq: Once | RESPIRATORY_TRACT | Status: AC
  Administered 2017-04-15: 2 via RESPIRATORY_TRACT
  Filled 2017-04-15: qty 6.7

## 2017-04-15 MED ORDER — AZITHROMYCIN 250 MG PO TABS: ORAL_TABLET | ORAL | 0 refills | Status: DC

## 2017-04-15 MED ORDER — PREDNISONE 50 MG PO TABS
60.0000 mg | ORAL_TABLET | Freq: Once | ORAL | Status: AC
  Administered 2017-04-15: 60 mg via ORAL
  Filled 2017-04-15: qty 1

## 2017-04-15 NOTE — ED Provider Notes (Signed)
[facility] DEPT Provider Note   CSN: [id number] Arrival date & time: 04/15/17  2002     History   Chief Complaint Chief Complaint  Patient presents with  . Cough    HPI Garet R Morgan is a 31 y.o. male.  HPI   Derrall R Baldi is a 31 y.o. male who presents to the Emergency Department complaining of cough and sore throat for 1 week. Cough is intermittently productive of yellow to green sputum.  He also reports having chills, sweats, and sore throat. Other family members have recently been sick as well. He is tried over-the-counter cough and cold medications without relief. He states cough is worse with exertion. He denies chest pain, shortness of breath, hemoptysis, rash or abdominal pain.  Past Medical History:  Diagnosis Date  . Anxiety    Panic attacks    Patient Active Problem List   Diagnosis Date Noted  . Abrasion, hand with infection 05/03/2014    Past Surgical History:  Procedure Laterality Date  . APPENDECTOMY    . HAND SURGERY    . HERNIA REPAIR    . I&D EXTREMITY Left 05/03/2014   Procedure: Irrigation and Debridement Left Hand;  Surgeon: Cammy Copa, MD;  Location: [facility] OR;  Service: Orthopedics;  Laterality: Left;  . ROOT CANAL         Home Medications    Prior to Admission medications   Medication Sig Start Date End Date Taking? Authorizing Provider  albuterol (PROVENTIL HFA;VENTOLIN HFA) 108 (90 Base) MCG/ACT inhaler Inhale 1-2 puffs into the lungs every 6 (six) hours as needed for wheezing or shortness of breath. 05/05/16   Elson Areas, PA-C  cyclobenzaprine (FLEXERIL) 10 MG tablet Take 1 tablet (10 mg total) by mouth 3 (three) times daily as needed. 12/25/16   Issabella Rix, PA-C  HYDROcodone-acetaminophen (NORCO/VICODIN) 5-325 MG tablet Take one-two tabs po q 4-6 hrs prn pain 12/25/16   Erlin Gardella, PA-C  ibuprofen (ADVIL,MOTRIN) 800 MG tablet Take 1 tablet (800 mg total) by mouth 3 (three) times daily. 12/25/16   Yaden Seith,  Babette Relic, PA-C    Family History No family history on file.  Social History Social History  Substance Use Topics  . Smoking status: Current Every Day Smoker    Packs/day: 1.50    Types: Cigarettes  . Smokeless tobacco: Never Used  . Alcohol use No     Allergies   Amoxicillin and Penicillins   Review of Systems Review of Systems  Constitutional: Positive for chills and fever. Negative for activity change and appetite change.  HENT: Positive for congestion and sore throat. Negative for facial swelling, rhinorrhea and trouble swallowing.   Eyes: Negative for visual disturbance.  Respiratory: Positive for cough. Negative for shortness of breath, wheezing and stridor.   Cardiovascular: Negative for chest pain.  Gastrointestinal: Negative for nausea and vomiting.  Musculoskeletal: Negative for neck pain and neck stiffness.  Skin: Negative.  Negative for wound.  Neurological: Negative for dizziness, weakness, numbness and headaches.  Hematological: Negative for adenopathy.  Psychiatric/Behavioral: Negative for confusion.  All other systems reviewed and are negative.    Physical Exam Updated Vital Signs BP 114/71   Pulse 73   Temp 98.7 F (37.1 C)   Resp 18   Ht  (1.753 m)   Wt 68 kg (150 lb)   SpO2 98%   BMI 22.15 kg/m   Physical Exam  Constitutional: He is oriented to person, place, and time. He appears well-developed and well-nourished. No  distress.  HENT:  Head: Normocephalic and atraumatic.  Right Ear: Tympanic membrane and ear canal normal.  Left Ear: Tympanic membrane and ear canal normal.  Mouth/Throat: Uvula is midline and mucous membranes are normal. Posterior oropharyngeal erythema present. No oropharyngeal exudate, posterior oropharyngeal edema or tonsillar abscesses. No tonsillar exudate.  Eyes: Pupils are equal, round, and reactive to light. EOM are normal.  Neck: Normal range of motion, full passive range of motion without pain and phonation normal.  Neck supple.  Cardiovascular: Normal rate, regular rhythm and intact distal pulses.   No murmur heard. Pulmonary/Chest: Effort normal. No stridor. No respiratory distress. He has no rales. He exhibits no tenderness.  Coarse lungs sounds bilaterally. no rales  Abdominal: Soft.  Musculoskeletal: Normal range of motion. He exhibits no edema.  Lymphadenopathy:    He has no cervical adenopathy.  Neurological: He is alert and oriented to person, place, and time. No sensory deficit. He exhibits normal muscle tone. Coordination normal.  Skin: Skin is warm and dry. Capillary refill takes less than 2 seconds.  Nursing note and vitals reviewed.    ED Treatments / Results  Labs (all labs ordered are listed, but only abnormal results are displayed) Labs Reviewed  RAPID STREP SCREEN (NOT AT [facility])  CULTURE, GROUP A STREP [facility])    EKG  EKG Interpretation None       Radiology Dg Chest 2 View  Result Date: 04/15/2017 CLINICAL DATA:  Cough with yellow phlegm EXAM: CHEST  2 VIEW COMPARISON:  05/05/2016 FINDINGS: Hyperinflation. Slight increased bronchitic changes. No consolidation or effusion. Normal heart size. No pneumothorax. IMPRESSION: Hyperinflation with bronchitic changes. No consolidation or focal pulmonary infiltrate is seen. Electronically Signed   By: Jasmine Pang M.D.   On: 04/15/2017 20:33    Procedures Procedures (including critical care time)  Medications Ordered in ED Medications  albuterol (PROVENTIL HFA;VENTOLIN HFA) 108 (90 Base) MCG/ACT inhaler 2 puff (not administered)  predniSONE (DELTASONE) tablet 60 mg (not administered)     Initial Impression / Assessment and Plan / ED Course  I have reviewed the triage vital signs and the nursing notes.  Pertinent labs & imaging results that were available during my care of the patient were reviewed by me and considered in my medical decision making (see chart for details).     Patient is nontoxic appearing. Mucous  membranes moist. strep neg, no PNA.   Vitals reviewed. Albuterol inhaler dispensed, return precautions discussed. Patient stable for discharge.  Final Clinical Impressions(s) / ED Diagnoses   Final diagnoses:  Bronchitis    New Prescriptions New Prescriptions   No medications on file     Pauline Aus, PA-C 04/16/17 1553    Jacalyn Lefevre, MD 04/21/17 1529

## 2017-04-15 NOTE — ED Triage Notes (Signed)
Pt c/o cough, chills, sore throat and chills x one week.

## 2017-04-15 NOTE — Discharge Instructions (Signed)
2 puffs of the inhaler 4 times a day as needed. Follow-up with your primary doctor or return here for any worsening symptoms.

## 2017-04-18 LAB — CULTURE, GROUP A STREP (THRC)

## 2017-08-02 ENCOUNTER — Encounter (HOSPITAL_COMMUNITY): Payer: Self-pay | Admitting: *Deleted

## 2017-08-02 ENCOUNTER — Emergency Department (HOSPITAL_COMMUNITY)
Admission: EM | Admit: 2017-08-02 | Discharge: 2017-08-02 | Disposition: A | Discharge status: Home / Self Care | Payer: Self-pay | Attending: Emergency Medicine | Admitting: Emergency Medicine

## 2017-08-02 ENCOUNTER — Emergency Department (HOSPITAL_COMMUNITY): Payer: Self-pay

## 2017-08-02 ENCOUNTER — Other Ambulatory Visit: Payer: Self-pay

## 2017-08-02 DIAGNOSIS — J0100 Acute maxillary sinusitis, unspecified: Secondary | ICD-10-CM | POA: Insufficient documentation

## 2017-08-02 DIAGNOSIS — Z79899 Other long term (current) drug therapy: Secondary | ICD-10-CM | POA: Insufficient documentation

## 2017-08-02 DIAGNOSIS — F17210 Nicotine dependence, cigarettes, uncomplicated: Secondary | ICD-10-CM | POA: Insufficient documentation

## 2017-08-02 MED ORDER — SULFAMETHOXAZOLE-TRIMETHOPRIM 800-160 MG PO TABS: 1.0000 | ORAL_TABLET | Freq: Two times a day (BID) | ORAL | 0 refills | Status: AC

## 2017-08-02 NOTE — Discharge Instructions (Signed)
Recheck in 3-4 days if not improving

## 2017-08-02 NOTE — ED Provider Notes (Signed)
[facility] EMERGENCY DEPARTMENT Provider Note   CSN: [id number] Arrival date & time: 08/02/17  1704     History   Chief Complaint Chief Complaint  Patient presents with  . Cough    HPI Ricardo Juarez is a 32 y.o. male.  The history is provided by the patient. No language interpreter was used.  Cough  This is a new problem. Episode onset: 3 weeks. The problem occurs constantly. The problem has been gradually worsening. The cough is non-productive. Associated symptoms include rhinorrhea and sore throat. He has tried nothing for the symptoms. The treatment provided no relief. He is not a smoker. His past medical history does not include asthma.  Pt has sinus pressure and congestion.  Pt was treated with zithromax.  Pt reports pain and congestion has gotten worse.   Past Medical History:  Diagnosis Date  . Anxiety    Panic attacks    Patient Active Problem List   Diagnosis Date Noted  . Abrasion, hand with infection 05/03/2014    Past Surgical History:  Procedure Laterality Date  . APPENDECTOMY    . HAND SURGERY    . HERNIA REPAIR    . I&D EXTREMITY Left 05/03/2014   Procedure: Irrigation and Debridement Left Hand;  Surgeon: Cammy Copa, MD;  Location: [facility] OR;  Service: Orthopedics;  Laterality: Left;  . ROOT CANAL         Home Medications    Prior to Admission medications   Medication Sig Start Date End Date Taking? Authorizing Provider  albuterol (PROVENTIL HFA;VENTOLIN HFA) 108 (90 Base) MCG/ACT inhaler Inhale 1-2 puffs into the lungs every 6 (six) hours as needed for wheezing or shortness of breath. 05/05/16   Elson Areas, PA-C  azithromycin (ZITHROMAX) 250 MG tablet Take first 2 tablets together, then 1 every day until finished. 04/15/17   Triplett, Tammy, PA-C  cyclobenzaprine (FLEXERIL) 10 MG tablet Take 1 tablet (10 mg total) by mouth 3 (three) times daily as needed. 12/25/16   Triplett, Tammy, PA-C  HYDROcodone-acetaminophen (NORCO/VICODIN) 5-325  MG tablet Take one-two tabs po q 4-6 hrs prn pain 12/25/16   Triplett, Tammy, PA-C  ibuprofen (ADVIL,MOTRIN) 800 MG tablet Take 1 tablet (800 mg total) by mouth 3 (three) times daily. 12/25/16   Triplett, Tammy, PA-C  sulfamethoxazole-trimethoprim (BACTRIM DS,SEPTRA DS) 800-160 MG tablet Take 1 tablet by mouth 2 (two) times daily for 7 days. 08/02/17 08/09/17  Elson Areas, PA-C    Family History No family history on file.  Social History Social History   Tobacco Use  . Smoking status: Current Every Day Smoker    Packs/day: 1.50    Types: Cigarettes  . Smokeless tobacco: Never Used  Substance Use Topics  . Alcohol use: No  . Drug use: No     Allergies   Amoxicillin and Penicillins   Review of Systems Review of Systems  HENT: Positive for rhinorrhea and sore throat.   Respiratory: Positive for cough.   All other systems reviewed and are negative.    Physical Exam Updated Vital Signs BP 111/70   Pulse 65   Temp 97.8 F (36.6 C) (Oral)   Resp 18   Ht 5\' 9"  (1.753 m)   Wt 70.3 kg (155 lb)   SpO2 98%   BMI 22.89 kg/m   Physical Exam  Constitutional: He is oriented to person, place, and time. He appears well-developed and well-nourished.  HENT:  Head: Normocephalic.  Right Ear: External ear normal.  Left  Ear: External ear normal.  Nose: Nose normal.  Mouth/Throat: Oropharynx is clear and moist.  Tender maxillary sinuses bilat   Eyes: EOM are normal.  Neck: Normal range of motion.  Pulmonary/Chest: Effort normal.  Abdominal: He exhibits no distension.  Musculoskeletal: Normal range of motion.  Neurological: He is alert and oriented to person, place, and time.  Psychiatric: He has a normal mood and affect.  Nursing note and vitals reviewed.    ED Treatments / Results  Labs (all labs ordered are listed, but only abnormal results are displayed) Labs Reviewed - No data to display  EKG  EKG Interpretation None       Radiology Dg Chest 2  View  Result Date: 08/02/2017 CLINICAL DATA:  Patient states cough and SOB since January 1st with no relief. Now with mid to left CP. Hx of bronchitis and current smoker. EXAM: CHEST - 2 VIEW COMPARISON:  04/15/2017 FINDINGS: Lungs are clear, hyperinflated. Heart size and mediastinal contours are within normal limits. No effusion. Visualized bones unremarkable. IMPRESSION: No acute cardiopulmonary disease. Electronically Signed   By: Corlis Leak  Hassell M.D.   On: 08/02/2017 17:43    Procedures Procedures (including critical care time)  Medications Ordered in ED Medications - No data to display   Initial Impression / Assessment and Plan / ED Course  I have reviewed the triage vital signs and the nursing notes.  Pertinent labs & imaging results that were available during my care of the patient were reviewed by me and considered in my medical decision making (see chart for details).     Pt is allergic to pcn.  I will treat with bactrim.  Pt advised recheck with primary in 3-4 days.    Final Clinical Impressions(s) / ED Diagnoses   Final diagnoses:  Acute maxillary sinusitis, recurrence not specified    ED Discharge Orders        Ordered    sulfamethoxazole-trimethoprim (BACTRIM DS,SEPTRA DS) 800-160 MG tablet  2 times daily     08/02/17 1832    An After Visit Summary was printed and given to the patient.    Elson AreasSofia, Radiah Lubinski K, Cordelia Poche-C 08/02/17 1951    Doug SouJacubowitz, Sam, MD 08/03/17 [phone]

## 2017-08-02 NOTE — ED Triage Notes (Signed)
Cough with nasal congestion...

## 2018-06-13 ENCOUNTER — Emergency Department (HOSPITAL_COMMUNITY)
Admission: EM | Admit: 2018-06-13 | Discharge: 2018-06-14 | Disposition: A | Discharge status: Home / Self Care | Payer: Self-pay | Attending: Emergency Medicine | Admitting: Emergency Medicine

## 2018-06-13 ENCOUNTER — Other Ambulatory Visit: Payer: Self-pay

## 2018-06-13 ENCOUNTER — Emergency Department (HOSPITAL_COMMUNITY): Payer: Self-pay

## 2018-06-13 ENCOUNTER — Encounter (HOSPITAL_COMMUNITY): Payer: Self-pay | Admitting: *Deleted

## 2018-06-13 DIAGNOSIS — R0789 Other chest pain: Secondary | ICD-10-CM | POA: Insufficient documentation

## 2018-06-13 DIAGNOSIS — R1084 Generalized abdominal pain: Secondary | ICD-10-CM | POA: Insufficient documentation

## 2018-06-13 DIAGNOSIS — F17210 Nicotine dependence, cigarettes, uncomplicated: Secondary | ICD-10-CM | POA: Insufficient documentation

## 2018-06-13 DIAGNOSIS — R05 Cough: Secondary | ICD-10-CM | POA: Insufficient documentation

## 2018-06-13 HISTORY — DX: Other specified postprocedural states: Z98.890

## 2018-06-13 MED ORDER — KETOROLAC TROMETHAMINE 30 MG/ML IJ SOLN
30.0000 mg | Freq: Once | INTRAMUSCULAR | Status: AC
  Administered 2018-06-13: 30 mg via INTRAMUSCULAR
  Filled 2018-06-13: qty 1

## 2018-06-13 NOTE — ED Triage Notes (Signed)
Left flank pain beginning yesterday, denies injury, denies painful urination, or temperature.

## 2018-06-14 LAB — TROPONIN I

## 2018-06-14 LAB — BASIC METABOLIC PANEL
ANION GAP: 6 (ref 5–15)
BUN: 12 mg/dL (ref 6–20)
CALCIUM: 8.9 mg/dL (ref 8.9–10.3)
CO2: 26 mmol/L (ref 22–32)
Chloride: 106 mmol/L (ref 98–111)
Creatinine, Ser: 0.95 mg/dL (ref 0.61–1.24)
GFR calc Af Amer: >60 mL/min (ref >60)
GLUCOSE: 91 mg/dL (ref 70–99)
Potassium: 3.7 mmol/L (ref 3.5–5.1)
Sodium: 138 mmol/L (ref 135–145)

## 2018-06-14 LAB — CBC WITH DIFFERENTIAL/PLATELET
Abs Immature Granulocytes: 0.02 10*3/uL (ref 0.00–0.07)
BASOS PCT: 2 %
Basophils Absolute: 0.1 10*3/uL (ref 0.0–0.1)
EOS ABS: 0.6 10*3/uL — AB (ref 0.0–0.5)
Eosinophils Relative: 7 %
HCT: 50.5 % (ref 39.0–52.0)
Hemoglobin: 17.6 g/dL — ABNORMAL HIGH (ref 13.0–17.0)
IMMATURE GRANULOCYTES: 0 %
Lymphocytes Relative: 38 %
Lymphs Abs: 3.3 10*3/uL (ref 0.7–4.0)
MCH: 31.7 pg (ref 26.0–34.0)
MCHC: 34.9 g/dL (ref 30.0–36.0)
MCV: 90.8 fL (ref 80.0–100.0)
MONOS PCT: 8 %
Monocytes Absolute: 0.7 10*3/uL (ref 0.1–1.0)
NEUTROS ABS: 4 10*3/uL (ref 1.7–7.7)
NEUTROS PCT: 45 %
PLATELETS: 243 10*3/uL (ref 150–400)
RBC: 5.56 MIL/uL (ref 4.22–5.81)
RDW: 12 % (ref 11.5–15.5)
WBC: 8.8 10*3/uL (ref 4.0–10.5)
nRBC: 0 % (ref 0.0–0.2)

## 2018-06-14 MED ORDER — NAPROXEN 500 MG PO TABS: 500.0000 mg | ORAL_TABLET | Freq: Two times a day (BID) | ORAL | 0 refills | Status: DC

## 2018-06-14 NOTE — ED Provider Notes (Signed)
[facility] EMERGENCY DEPARTMENT Provider Note   CSN: [id number] Arrival date & time: 06/13/18  2205     History   Chief Complaint Chief Complaint  Patient presents with  . Flank Pain    HPI Ricardo Juarez is a 32 y.o. male.  HPI  This is a 32 year old male who presents with left-sided chest pain.  Patient reports onset of symptoms yesterday.  He reports waxing and waning sharp left-sided pain that radiates into his chest.  Comes and goes.  It is worse with movement.  Denies injury.  Did develop a cough earlier today.  It is nonproductive.  He denies fevers.  He denies any nausea, vomiting, diarrhea, urinary symptoms.  Currently he rates his pain at 6 out of 10.  He took Tylenol with minimal relief.  Denies any leg swelling or history of blood clots.  Past Medical History:  Diagnosis Date  . Anxiety    Panic attacks  . History of hand surgery 2017    Patient Active Problem List   Diagnosis Date Noted  . Abrasion, hand with infection 05/03/2014    Past Surgical History:  Procedure Laterality Date  . APPENDECTOMY    . HAND SURGERY    . HERNIA REPAIR    . I&D EXTREMITY Left 05/03/2014   Procedure: Irrigation and Debridement Left Hand;  Surgeon: Cammy Copa, MD;  Location: [facility] OR;  Service: Orthopedics;  Laterality: Left;  . ROOT CANAL          Home Medications    Prior to Admission medications   Medication Sig Start Date End Date Taking? Authorizing Provider  albuterol (PROVENTIL HFA;VENTOLIN HFA) 108 (90 Base) MCG/ACT inhaler Inhale 1-2 puffs into the lungs every 6 (six) hours as needed for wheezing or shortness of breath. 05/05/16   Elson Areas, PA-C  azithromycin (ZITHROMAX) 250 MG tablet Take first 2 tablets together, then 1 every day until finished. 04/15/17   Triplett, Tammy, PA-C  cyclobenzaprine (FLEXERIL) 10 MG tablet Take 1 tablet (10 mg total) by mouth 3 (three) times daily as needed. 12/25/16   Triplett, Tammy, PA-C  HYDROcodone-acetaminophen  (NORCO/VICODIN) 5-325 MG tablet Take one-two tabs po q 4-6 hrs prn pain 12/25/16   Triplett, Tammy, PA-C  ibuprofen (ADVIL,MOTRIN) 800 MG tablet Take 1 tablet (800 mg total) by mouth 3 (three) times daily. 12/25/16   Triplett, Tammy, PA-C  naproxen (NAPROSYN) 500 MG tablet Take 1 tablet (500 mg total) by mouth 2 (two) times daily. 06/14/18   Kylen Schliep, Mayer Masker, MD    Family History History reviewed. No pertinent family history.  Social History Social History   Tobacco Use  . Smoking status: Current Every Day Smoker    Packs/day: 1.50    Types: Cigarettes  . Smokeless tobacco: Never Used  Substance Use Topics  . Alcohol use: No  . Drug use: No     Allergies   Amoxicillin and Penicillins   Review of Systems Review of Systems  Constitutional: Negative for fever.  Respiratory: Positive for cough. Negative for shortness of breath.   Cardiovascular: Positive for chest pain. Negative for leg swelling.  Gastrointestinal: Negative for abdominal pain and nausea.  Genitourinary: Negative for dysuria and hematuria.  All other systems reviewed and are negative.    Physical Exam Updated Vital Signs BP 113/81   Pulse (!) 48   Temp 97.9 F (36.6 C) (Oral)   Resp 17   Ht 1.753 m (5\' 9" )   Wt 68 kg  SpO2 99%   BMI 22.15 kg/m   Physical Exam  Constitutional: He is oriented to person, place, and time. He appears well-developed and well-nourished.  HENT:  Head: Normocephalic and atraumatic.  Neck: Neck supple.  Cardiovascular: Normal rate, regular rhythm and normal heart sounds.  No murmur heard. Pulmonary/Chest: Effort normal and breath sounds normal. No respiratory distress. He has no wheezes. He exhibits tenderness.  Tenderness to palpation left chest wall, no crepitus  Abdominal: Soft. Bowel sounds are normal. There is no tenderness. There is no rebound.  Musculoskeletal: He exhibits no edema or tenderness.  Neurological: He is alert and oriented to person, place, and time.    Skin: Skin is warm and dry.  Psychiatric: He has a normal mood and affect.  Nursing note and vitals reviewed.    ED Treatments / Results  Labs (all labs ordered are listed, but only abnormal results are displayed) Labs Reviewed  CBC WITH DIFFERENTIAL/PLATELET - Abnormal; Notable for the following components:      Result Value   Hemoglobin 17.6 (*)    Eosinophils Absolute 0.6 (*)    All other components within normal limits  BASIC METABOLIC PANEL  TROPONIN I    EKG EKG Interpretation  Date/Time:  Tuesday June 14 2018 01:33:34 EST Ventricular Rate:  49 PR Interval:    QRS Duration: 92 QT Interval:  415 QTC Calculation: 375 R Axis:   79 Text Interpretation:  Sinus bradycardia Anteroseptal infarct, old Early repolarization Confirmed by Ross Marcus ([id number]) on 06/14/2018 1:37:35 AM   Radiology Dg Abdomen Acute W/chest  Result Date: 06/14/2018 CLINICAL DATA:  Left flank pain, chest pain EXAM: DG ABDOMEN ACUTE W/ 1V CHEST COMPARISON:  08/02/2017 FINDINGS: Gas throughout mildly prominent large and small bowel loops. No evidence of bowel obstruction, organomegaly, free air or suspicious calcification. Heart and mediastinal contours are within normal limits. No focal opacities or effusions. No acute bony abnormality. IMPRESSION: No evidence of bowel obstruction or free air. No active cardiopulmonary disease. Electronically Signed   By: Charlett Nose M.D.   On: 06/14/2018 01:05    Procedures Procedures (including critical care time)  Medications Ordered in ED Medications  ketorolac (TORADOL) 30 MG/ML injection 30 mg (30 mg Intramuscular Given 06/13/18 2339)     Initial Impression / Assessment and Plan / ED Course  I have reviewed the triage vital signs and the nursing notes.  Pertinent labs & imaging results that were available during my care of the patient were reviewed by me and considered in my medical decision making (see chart for details).     Patient presents  with left chest wall pain.  He is overall nontoxic-appearing vital signs are reassuring.  Pulmonary exam is reassuring.  He has reproducible tenderness to palpation without crepitus.  EKG is overall reassuring.  Basic lab work including troponin is negative.  Doubt ACS.  X-ray shows no evidence of pneumothorax or pneumonia.  Pain is over the left lower ribs.  No abdominal symptoms.  Patient was given Toradol.  On recheck, he states he feels much better.  He still has some pain with movement but it is much improved.  Will treat for musculoskeletal etiology with naproxen.  After history, exam, and medical workup I feel the patient has been appropriately medically screened and is safe for discharge home. Pertinent diagnoses were discussed with the patient. Patient was given return precautions.   Final Clinical Impressions(s) / ED Diagnoses   Final diagnoses:  Chest wall pain  ED Discharge Orders         Ordered    naproxen (NAPROSYN) 500 MG tablet  2 times daily     06/14/18 0141           Adira Limburg, Mayer Maskerourtney F, MD 06/14/18 [phone]

## 2018-09-06 ENCOUNTER — Emergency Department (HOSPITAL_COMMUNITY): Payer: Worker's Compensation

## 2018-09-06 ENCOUNTER — Other Ambulatory Visit: Payer: Self-pay

## 2018-09-06 ENCOUNTER — Emergency Department (HOSPITAL_COMMUNITY)
Admission: EM | Admit: 2018-09-06 | Discharge: 2018-09-06 | Disposition: A | Discharge status: Home / Self Care | Payer: Worker's Compensation | Attending: Emergency Medicine | Admitting: Emergency Medicine

## 2018-09-06 ENCOUNTER — Encounter (HOSPITAL_COMMUNITY): Payer: Self-pay | Admitting: Emergency Medicine

## 2018-09-06 DIAGNOSIS — Y990 Civilian activity done for income or pay: Secondary | ICD-10-CM | POA: Insufficient documentation

## 2018-09-06 DIAGNOSIS — S39012A Strain of muscle, fascia and tendon of lower back, initial encounter: Secondary | ICD-10-CM | POA: Diagnosis not present

## 2018-09-06 DIAGNOSIS — M62830 Muscle spasm of back: Secondary | ICD-10-CM | POA: Diagnosis not present

## 2018-09-06 DIAGNOSIS — S3992XA Unspecified injury of lower back, initial encounter: Secondary | ICD-10-CM | POA: Diagnosis present

## 2018-09-06 DIAGNOSIS — F17210 Nicotine dependence, cigarettes, uncomplicated: Secondary | ICD-10-CM | POA: Insufficient documentation

## 2018-09-06 DIAGNOSIS — Y939 Activity, unspecified: Secondary | ICD-10-CM | POA: Diagnosis not present

## 2018-09-06 DIAGNOSIS — X500XXA Overexertion from strenuous movement or load, initial encounter: Secondary | ICD-10-CM | POA: Insufficient documentation

## 2018-09-06 DIAGNOSIS — Z79899 Other long term (current) drug therapy: Secondary | ICD-10-CM | POA: Insufficient documentation

## 2018-09-06 DIAGNOSIS — Y9259 Other trade areas as the place of occurrence of the external cause: Secondary | ICD-10-CM | POA: Insufficient documentation

## 2018-09-06 MED ORDER — IBUPROFEN 600 MG PO TABS: 600.0000 mg | ORAL_TABLET | Freq: Four times a day (QID) | ORAL | 0 refills | Status: DC

## 2018-09-06 MED ORDER — IBUPROFEN 800 MG PO TABS
800.0000 mg | ORAL_TABLET | Freq: Once | ORAL | Status: AC
  Administered 2018-09-06: 800 mg via ORAL
  Filled 2018-09-06: qty 1

## 2018-09-06 MED ORDER — CYCLOBENZAPRINE HCL 10 MG PO TABS
10.0000 mg | ORAL_TABLET | Freq: Once | ORAL | Status: AC
  Administered 2018-09-06: 10 mg via ORAL
  Filled 2018-09-06: qty 1

## 2018-09-06 MED ORDER — CYCLOBENZAPRINE HCL 10 MG PO TABS: 10.0000 mg | ORAL_TABLET | Freq: Three times a day (TID) | ORAL | 0 refills | Status: DC

## 2018-09-06 MED ORDER — ACETAMINOPHEN 500 MG PO TABS
1000.0000 mg | ORAL_TABLET | Freq: Once | ORAL | Status: AC
  Administered 2018-09-06: 1000 mg via ORAL
  Filled 2018-09-06: qty 2

## 2018-09-06 NOTE — Discharge Instructions (Addendum)
Your neurologic examination is negative for acute neurologic deficit.  Your x-ray shows evidence of spasm in the lumbar area.  There is no compression fracture, and there is no dislocation.  You have some degenerative changes of your back, these are unchanged from previous back evaluations.  Please rest her back.  Heating pad to the area may be helpful.  Please use ibuprofen with breakfast, lunch, dinner, and at bedtime.  Use Flexeril 3 times daily for spasm pain. This medication may cause drowsiness. Please do not drink, drive, or participate in activity that requires concentration while taking this medication.  Please see Dr. Dion Saucier for orthopedic evaluation and management if this is not improving.

## 2018-09-06 NOTE — ED Provider Notes (Signed)
[facility] EMERGENCY DEPARTMENT Provider Note   CSN: [id number] Arrival date & time: 09/06/18  1210    History   Chief Complaint Chief Complaint  Patient presents with  . Back Pain    HPI Ricardo Juarez is a 33 y.o. male.     Patient is a 33 year old male who presents to the emergency department with a complaint of back pain.  The patient states while he was at work he was moving a heater.  He says during the course of that he felt a pop in his back and the pain radiated down into his legs and he felt a little unsteady for a few minutes.  Following this he has been having pain in his back.  There was no loss of bowel or bladder function.  There was no loss of sensation.  No other injury reported.  The history is provided by the patient.    Past Medical History:  Diagnosis Date  . Anxiety    Panic attacks  . History of hand surgery 2017    Patient Active Problem List   Diagnosis Date Noted  . Abrasion, hand with infection 05/03/2014    Past Surgical History:  Procedure Laterality Date  . APPENDECTOMY    . HAND SURGERY    . HERNIA REPAIR    . I&D EXTREMITY Left 05/03/2014   Procedure: Irrigation and Debridement Left Hand;  Surgeon: Cammy Copa, MD;  Location: [facility] OR;  Service: Orthopedics;  Laterality: Left;  . ROOT CANAL          Home Medications    Prior to Admission medications   Medication Sig Start Date End Date Taking? Authorizing Provider  albuterol (PROVENTIL HFA;VENTOLIN HFA) 108 (90 Base) MCG/ACT inhaler Inhale 1-2 puffs into the lungs every 6 (six) hours as needed for wheezing or shortness of breath. 05/05/16   Elson Areas, PA-C  azithromycin (ZITHROMAX) 250 MG tablet Take first 2 tablets together, then 1 every day until finished. 04/15/17   Triplett, Tammy, PA-C  cyclobenzaprine (FLEXERIL) 10 MG tablet Take 1 tablet (10 mg total) by mouth 3 (three) times daily as needed. 12/25/16   Triplett, Tammy, PA-C  HYDROcodone-acetaminophen  (NORCO/VICODIN) 5-325 MG tablet Take one-two tabs po q 4-6 hrs prn pain 12/25/16   Triplett, Tammy, PA-C  ibuprofen (ADVIL,MOTRIN) 800 MG tablet Take 1 tablet (800 mg total) by mouth 3 (three) times daily. 12/25/16   Triplett, Tammy, PA-C  naproxen (NAPROSYN) 500 MG tablet Take 1 tablet (500 mg total) by mouth 2 (two) times daily. 06/14/18   Horton, Mayer Masker, MD    Family History History reviewed. No pertinent family history.  Social History Social History   Tobacco Use  . Smoking status: Current Every Day Smoker    Packs/day: 1.50    Types: Cigarettes  . Smokeless tobacco: Never Used  Substance Use Topics  . Alcohol use: No  . Drug use: No     Allergies   Amoxicillin and Penicillins   Review of Systems Review of Systems  Constitutional: Positive for fever. Negative for activity change.       All ROS Neg except as noted in HPI  HENT: Negative for nosebleeds.   Eyes: Negative for photophobia and discharge.  Respiratory: Negative for cough, shortness of breath and wheezing.   Cardiovascular: Negative for chest pain and palpitations.  Gastrointestinal: Negative for abdominal pain and blood in stool.  Genitourinary: Negative for dysuria, frequency and hematuria.  Musculoskeletal: Positive for back pain. Negative  for arthralgias and neck pain.  Skin: Negative.   Neurological: Negative for dizziness, seizures and speech difficulty.  Psychiatric/Behavioral: Negative for confusion and hallucinations.     Physical Exam Updated Vital Signs BP 119/74 (BP Location: Right Arm)   Pulse 70   Temp 98 F (36.7 C) (Oral)   Resp 18   Ht  (1.727 m)   Wt 68 kg   SpO2 99%   BMI 22.81 kg/m   Physical Exam Vitals signs and nursing note reviewed.  Constitutional:      General: He is not in acute distress.    Appearance: He is well-developed. He is not toxic-appearing.  HENT:     Head: Normocephalic and atraumatic.     Right Ear: Tympanic membrane and external ear normal.      Left Ear: Tympanic membrane and external ear normal.  Eyes:     General: Lids are normal. No scleral icterus.       Right eye: No discharge.        Left eye: No discharge.     Conjunctiva/sclera: Conjunctivae normal.     Pupils: Pupils are equal, round, and reactive to light.  Neck:     Musculoskeletal: Normal range of motion and neck supple.     Vascular: No carotid bruit.     Trachea: No tracheal deviation.  Cardiovascular:     Rate and Rhythm: Normal rate and regular rhythm.     Pulses: Normal pulses.     Heart sounds: Normal heart sounds.  Pulmonary:     Effort: Pulmonary effort is normal. No respiratory distress.     Breath sounds: Normal breath sounds. No stridor. No wheezing or rales.  Abdominal:     General: Bowel sounds are normal. There is no distension.     Palpations: Abdomen is soft.     Tenderness: There is no abdominal tenderness. There is no guarding or rebound.  Musculoskeletal:        General: No tenderness.     Lumbar back: He exhibits decreased range of motion, pain and spasm.       Back:  Lymphadenopathy:     Head:     Right side of head: No submandibular adenopathy.     Left side of head: No submandibular adenopathy.     Cervical: No cervical adenopathy.  Skin:    General: Skin is warm and dry.     Findings: No rash.  Neurological:     Mental Status: He is alert and oriented to person, place, and time.     Cranial Nerves: No cranial nerve deficit.     Sensory: No sensory deficit.     Motor: No abnormal muscle tone or seizure activity.     Coordination: Coordination normal.  Psychiatric:        Speech: Speech normal.      ED Treatments / Results  Labs (all labs ordered are listed, but only abnormal results are displayed) Labs Reviewed - No data to display  EKG None  Radiology No results found.  Procedures Procedures (including critical care time)  Medications Ordered in ED Medications - No data to display   Initial Impression /  Assessment and Plan / ED Course  I have reviewed the triage vital signs and the nursing notes.  Pertinent labs & imaging results that were available during my care of the patient were reviewed by me and considered in my medical decision making (see chart for details).  Final Clinical Impressions(s) / ED Diagnoses MDM Vital signs wnl NO gross neurologic deficits . 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  This was noted on a prior CT scan as well back in 2018.  There is noted Schmorl nodes from T10-T8 11, and on through to L5 and S1.  There is no acute compression fracture.  There is slight straightening of the lumbar spine consistent with spasm.  I discussed with the patient that he has spasm pain with range of motion exercises and also seen on the x-ray.  The patient is treated with a muscle relaxer, and anti-inflammatory pain medication.  Have asked the patient to rest his back over the next couple of days.  Patient has been given a work note for this as well.   Final diagnoses:  Strain of lumbar region, initial encounter  Muscle spasm of back    ED Discharge Orders         Ordered    cyclobenzaprine (FLEXERIL) 10 MG tablet  3 times daily     09/06/18 1423    ibuprofen (ADVIL,MOTRIN) 600 MG tablet  4 times daily     09/06/18 1423           Ivery Quale, Cordelia Poche 09/06/18 2035    Mesner, Barbara Cower, MD 09/08/18 [phone]

## 2018-09-06 NOTE — ED Triage Notes (Signed)
Patient states he was at work today and picked up a heater and started having pain to lower back.

## 2018-09-06 NOTE — ED Notes (Signed)
Walked patient to lab for urine drug screen for worker's comp. Patient refused wheelchair. States "it feels better to walk."

## 2018-09-06 NOTE — ED Notes (Signed)
Advised patient not to drive after discharge due to medication administration. Also advised patient not to drive while taking prescription pain medication. Patient verbalized understanding. Discharged with significant other and father to drive him home.

## 2019-08-04 ENCOUNTER — Ambulatory Visit: Payer: HRSA Program | Attending: Internal Medicine

## 2019-08-04 ENCOUNTER — Other Ambulatory Visit: Payer: Self-pay

## 2019-08-04 DIAGNOSIS — Z20822 Contact with and (suspected) exposure to covid-19: Secondary | ICD-10-CM | POA: Insufficient documentation

## 2019-08-05 LAB — NOVEL CORONAVIRUS, NAA: SARS-CoV-2, NAA: NOT DETECTED

## 2019-09-04 ENCOUNTER — Other Ambulatory Visit: Payer: Self-pay

## 2019-09-04 ENCOUNTER — Ambulatory Visit: Payer: Self-pay | Attending: Internal Medicine

## 2019-09-04 DIAGNOSIS — Z20822 Contact with and (suspected) exposure to covid-19: Secondary | ICD-10-CM | POA: Insufficient documentation

## 2019-09-05 LAB — NOVEL CORONAVIRUS, NAA: SARS-CoV-2, NAA: NOT DETECTED

## 2020-01-15 ENCOUNTER — Other Ambulatory Visit: Payer: Self-pay

## 2020-01-15 ENCOUNTER — Emergency Department (HOSPITAL_COMMUNITY)
Admission: EM | Admit: 2020-01-15 | Discharge: 2020-01-15 | Disposition: A | Discharge status: Home / Self Care | Payer: Self-pay | Attending: Emergency Medicine | Admitting: Emergency Medicine

## 2020-01-15 ENCOUNTER — Encounter (HOSPITAL_COMMUNITY): Payer: Self-pay | Admitting: Emergency Medicine

## 2020-01-15 DIAGNOSIS — F17210 Nicotine dependence, cigarettes, uncomplicated: Secondary | ICD-10-CM | POA: Insufficient documentation

## 2020-01-15 DIAGNOSIS — K047 Periapical abscess without sinus: Secondary | ICD-10-CM | POA: Insufficient documentation

## 2020-01-15 MED ORDER — CLINDAMYCIN HCL 150 MG PO CAPS
300.0000 mg | ORAL_CAPSULE | Freq: Once | ORAL | Status: AC
  Administered 2020-01-15: 21:00:00 300 mg via ORAL
  Filled 2020-01-15: qty 2

## 2020-01-15 MED ORDER — DICLOFENAC SODIUM 75 MG PO TBEC
75.0000 mg | DELAYED_RELEASE_TABLET | Freq: Once | ORAL | Status: DC
  Filled 2020-01-15: qty 1

## 2020-01-15 MED ORDER — CLINDAMYCIN HCL 300 MG PO CAPS: 300.0000 mg | ORAL_CAPSULE | Freq: Three times a day (TID) | ORAL | 0 refills | Status: AC

## 2020-01-15 MED ORDER — IBUPROFEN 800 MG PO TABS
800.0000 mg | ORAL_TABLET | Freq: Once | ORAL | Status: AC
  Administered 2020-01-15: 21:00:00 800 mg via ORAL
  Filled 2020-01-15: qty 1

## 2020-01-15 MED ORDER — DICLOFENAC SODIUM 75 MG PO TBEC: 75.0000 mg | DELAYED_RELEASE_TABLET | Freq: Two times a day (BID) | ORAL | 0 refills | Status: DC

## 2020-01-15 NOTE — ED Notes (Signed)
Pt with left lower back dental pain.

## 2020-01-15 NOTE — ED Triage Notes (Signed)
Pt c/o dental pain that started yesterday.Pt states he has a tooth on the left bottom that he thinks may have an abscess. Pt states he had a filling in that tooth that came out about 2 years ago but never went to get it fixed.

## 2020-01-15 NOTE — ED Provider Notes (Signed)
[facility] EMERGENCY DEPARTMENT Provider Note   CSN: [id number] Arrival date & time: 01/15/20  1929     History Chief Complaint  Patient presents with  . Dental Pain    Caleb Ha Placeres is a 34 y.o. male.  The history is provided by the patient. No language interpreter was used.  Dental Pain Location:  Lower Lower teeth location:  27/RL cuspid, 28/RL 1st bicuspid and 29/RL 2nd bicuspid Quality:  Aching Severity:  Moderate Onset quality:  Gradual Pt complains of swelling to lower gum.  No dentist     Past Medical History:  Diagnosis Date  . Anxiety    Panic attacks  . History of hand surgery 2017    Patient Active Problem List   Diagnosis Date Noted  . Abrasion, hand with infection 05/03/2014    Past Surgical History:  Procedure Laterality Date  . APPENDECTOMY    . HAND SURGERY    . HERNIA REPAIR    . I & D EXTREMITY Left 05/03/2014   Procedure: Irrigation and Debridement Left Hand;  Surgeon: Cammy Copa, MD;  Location: [facility] OR;  Service: Orthopedics;  Laterality: Left;  . ROOT CANAL         History reviewed. No pertinent family history.  Social History   Tobacco Use  . Smoking status: Current Every Day Smoker    Packs/day: 1.50    Types: Cigarettes  . Smokeless tobacco: Never Used  Substance Use Topics  . Alcohol use: No  . Drug use: No    Home Medications Prior to Admission medications   Medication Sig Start Date End Date Taking? Authorizing Provider  albuterol (PROVENTIL HFA;VENTOLIN HFA) 108 (90 Base) MCG/ACT inhaler Inhale 1-2 puffs into the lungs every 6 (six) hours as needed for wheezing or shortness of breath. 05/05/16  Yes Cheron Schaumann K, PA-C  benzocaine (ANBESOL) 10 % mucosal gel Use as directed 1 application in the mouth or throat as needed for mouth pain.   Yes [provider]  ibuprofen (ADVIL) 200 MG tablet Take 400 mg by mouth every 6 (six) hours as needed for mild pain or moderate pain.   Yes [provider]  ibuprofen (ADVIL) 800 MG tablet Take 800 mg by mouth once as needed for moderate pain.   Yes [provider]  naproxen sodium (ALEVE) 220 MG tablet Take 220 mg by mouth 2 (two) times daily as needed (for pain).   Yes [provider]    Allergies    Amoxicillin and Penicillins  Review of Systems   Review of Systems  All other systems reviewed and are negative.   Physical Exam Updated Vital Signs BP 131/89 (BP Location: Right Arm)   Pulse 63   Temp 98.5 F (36.9 C) (Oral)   Resp 19   Ht 5\' 9"  (1.753 m)   Wt 68.9 kg   SpO2 97%   BMI 22.45 kg/m   Physical Exam Vitals and nursing note reviewed.  Constitutional:      Appearance: He is well-developed.  HENT:     Head: Normocephalic and atraumatic.     Mouth/Throat:     Comments: Severe dental decay, swelling lower gums  Eyes:     Conjunctiva/sclera: Conjunctivae normal.  Cardiovascular:     Rate and Rhythm: Normal rate.     Heart sounds: No murmur heard.   Pulmonary:     Effort: Pulmonary effort is normal. No respiratory distress.  Abdominal:     Tenderness: There is no  abdominal tenderness.  Musculoskeletal:     Cervical back: Neck supple.  Skin:    General: Skin is warm and dry.  Neurological:     Mental Status: He is alert.     ED Results / Procedures / Treatments   Labs (all labs ordered are listed, but only abnormal results are displayed) Labs Reviewed - No data to display  EKG None  Radiology No results found.  Procedures Procedures (including critical care time)  Medications Ordered in ED Medications - No data to display  ED Course  I have reviewed the triage vital signs and the nursing notes.  Pertinent labs & imaging results that were available during my care of the patient were reviewed by me and considered in my medical decision making (see chart for details).    MDM Rules/Calculators/A&P                          MDM:  Pt advised to schedule dental followup    Final Clinical Impression(s) / ED Diagnoses Final diagnoses:  Dental abscess    Rx / DC Orders ED Discharge Orders         Ordered    clindamycin (CLEOCIN) 300 MG capsule  3 times daily     Discontinue  Reprint     01/15/20 2110    diclofenac (VOLTAREN) 75 MG EC tablet  2 times daily     Discontinue  Reprint     01/15/20 2110        An After Visit Summary was printed and given to the patient.    Osie Cheeks 01/15/20 2219    Vanetta Mulders, MD 01/17/20 [phone]

## 2020-04-26 DIAGNOSIS — F17200 Nicotine dependence, unspecified, uncomplicated: Secondary | ICD-10-CM | POA: Diagnosis not present

## 2020-04-26 DIAGNOSIS — Z0001 Encounter for general adult medical examination with abnormal findings: Secondary | ICD-10-CM | POA: Diagnosis not present

## 2020-04-26 DIAGNOSIS — J42 Unspecified chronic bronchitis: Secondary | ICD-10-CM | POA: Diagnosis not present

## 2020-04-26 DIAGNOSIS — F419 Anxiety disorder, unspecified: Secondary | ICD-10-CM | POA: Diagnosis not present

## 2021-03-14 ENCOUNTER — Encounter (HOSPITAL_COMMUNITY): Payer: Self-pay

## 2021-03-14 ENCOUNTER — Other Ambulatory Visit: Payer: Self-pay

## 2021-03-14 ENCOUNTER — Emergency Department (HOSPITAL_COMMUNITY)
Admission: EM | Admit: 2021-03-14 | Discharge: 2021-03-15 | Disposition: A | Discharge status: Home / Self Care | Payer: Self-pay | Attending: Emergency Medicine | Admitting: Emergency Medicine

## 2021-03-14 DIAGNOSIS — F17210 Nicotine dependence, cigarettes, uncomplicated: Secondary | ICD-10-CM | POA: Insufficient documentation

## 2021-03-14 DIAGNOSIS — U071 COVID-19: Secondary | ICD-10-CM | POA: Insufficient documentation

## 2021-03-14 DIAGNOSIS — Z28310 Unvaccinated for covid-19: Secondary | ICD-10-CM | POA: Insufficient documentation

## 2021-03-14 DIAGNOSIS — R000 Tachycardia, unspecified: Secondary | ICD-10-CM | POA: Insufficient documentation

## 2021-03-14 DIAGNOSIS — M545 Low back pain, unspecified: Secondary | ICD-10-CM | POA: Insufficient documentation

## 2021-03-14 DIAGNOSIS — R309 Painful micturition, unspecified: Secondary | ICD-10-CM | POA: Insufficient documentation

## 2021-03-14 NOTE — ED Triage Notes (Signed)
Pov from home with cc of fever and back pain since 2pm.  Burns and difficulty with urination  States fever at home was 102. Took tyl cold/flu and Excedrin around 4pm.   Denies any sick contacts.

## 2021-03-15 ENCOUNTER — Emergency Department (HOSPITAL_COMMUNITY): Payer: Self-pay

## 2021-03-15 LAB — RESP PANEL BY RT-PCR (FLU A&B, COVID) ARPGX2
Influenza A by PCR: NEGATIVE
Influenza B by PCR: NEGATIVE
SARS Coronavirus 2 by RT PCR: POSITIVE — AB

## 2021-03-15 LAB — URINALYSIS, ROUTINE W REFLEX MICROSCOPIC
Bilirubin Urine: NEGATIVE
Glucose, UA: NEGATIVE mg/dL
Hgb urine dipstick: NEGATIVE
Ketones, ur: NEGATIVE mg/dL
Leukocytes,Ua: NEGATIVE
Nitrite: NEGATIVE
Protein, ur: NEGATIVE mg/dL
Specific Gravity, Urine: 1.02 (ref 1.005–1.030)
pH: 5.5 (ref 5.0–8.0)

## 2021-03-15 MED ORDER — IBUPROFEN 800 MG PO TABS
800.0000 mg | ORAL_TABLET | Freq: Once | ORAL | Status: AC
  Administered 2021-03-15: 800 mg via ORAL
  Filled 2021-03-15: qty 1

## 2021-03-15 MED ORDER — ACETAMINOPHEN 500 MG PO TABS
1000.0000 mg | ORAL_TABLET | Freq: Once | ORAL | Status: AC
  Administered 2021-03-15: 1000 mg via ORAL
  Filled 2021-03-15: qty 2

## 2021-03-15 NOTE — Discharge Instructions (Addendum)
Your COVID test is positive.  Keep yourself quarantined for total of 5 days.  Use Tylenol or Motrin as needed for aches and fever.  Keep yourself hydrated.  Follow-up with your doctor.  Return to the ED with difficulty breathing, chest pain, difficulty eating or drinking or any other concerns

## 2021-03-15 NOTE — ED Notes (Signed)
Date and time results received: 03/15/21 0131 (use smartphrase ".now" to insert current time)  Test: COVID Critical Value: positive  Name of Provider Notified: Randel Books, MD

## 2021-03-15 NOTE — ED Notes (Signed)
Discharge instructions discussed with pt. Pt verbalized understanding with no question at this time. Work note given to pt.

## 2021-03-15 NOTE — ED Provider Notes (Signed)
[facility] EMERGENCY DEPARTMENT Provider Note   CSN: [id number] Arrival date & time: 03/14/21  2330     History Chief Complaint  Patient presents with   Fever    Ricardo Juarez is a 35 y.o. male.  Patient with a 1 day history of diffuse body aches, low back pain, fever to 105, congestion, cough, chills, nausea.  Denies travel or sick contacts.  Reports negative COVID test at home today.  Complains of pain to his low back that radiates diffusely.  Complains of diffuse body aches and soreness all over.  Does have pain with urination and burning with urination.  Also with headache, sore throat, cough and nausea.  No vomiting.  No chest pain or shortness of breath.  No abdominal pain.  No rash.  He is not COVID vaccinated.  Took Excedrin at home around 4 PM. Denies any history of IV drug abuse or cancer.  No bowel or bladder incontinence. No focal weakness, numbness or tingling.  The history is provided by the patient.  Fever Associated symptoms: congestion, cough, dysuria, headaches, myalgias, nausea and sore throat   Associated symptoms: no chest pain, no rash and no vomiting       Past Medical History:  Diagnosis Date   Anxiety    Panic attacks   History of hand surgery 2017    Patient Active Problem List   Diagnosis Date Noted   Abrasion, hand with infection 05/03/2014    Past Surgical History:  Procedure Laterality Date   APPENDECTOMY     HAND SURGERY     HERNIA REPAIR     I & D EXTREMITY Left 05/03/2014   Procedure: Irrigation and Debridement Left Hand;  Surgeon: Cammy Copa, MD;  Location: [facility] OR;  Service: Orthopedics;  Laterality: Left;   ROOT CANAL         History reviewed. No pertinent family history.  Social History   Tobacco Use   Smoking status: Every Day    Packs/day: 1.50    Types: Cigarettes   Smokeless tobacco: Never  Substance Use Topics   Alcohol use: Yes    Comment: occ   Drug use: No    Home Medications Prior to Admission  medications   Medication Sig Start Date End Date Taking? Authorizing Provider  albuterol (PROVENTIL HFA;VENTOLIN HFA) 108 (90 Base) MCG/ACT inhaler Inhale 1-2 puffs into the lungs every 6 (six) hours as needed for wheezing or shortness of breath. 05/05/16   Elson Areas, PA-C  benzocaine (ANBESOL) 10 % mucosal gel Use as directed 1 application in the mouth or throat as needed for mouth pain.    [provider]  diclofenac (VOLTAREN) 75 MG EC tablet Take 1 tablet (75 mg total) by mouth 2 (two) times daily. 01/15/20   Elson Areas, PA-C  ibuprofen (ADVIL) 200 MG tablet Take 400 mg by mouth every 6 (six) hours as needed for mild pain or moderate pain.    [provider]  ibuprofen (ADVIL) 800 MG tablet Take 800 mg by mouth once as needed for moderate pain.    [provider]  naproxen sodium (ALEVE) 220 MG tablet Take 220 mg by mouth 2 (two) times daily as needed (for pain).    [provider]    Allergies    Amoxicillin and Penicillins  Review of Systems   Review of Systems  Constitutional:  Positive for activity change, appetite change, fatigue and fever.  HENT:  Positive for congestion and sore  throat.   Respiratory:  Positive for cough.   Cardiovascular:  Negative for chest pain.  Gastrointestinal:  Positive for nausea. Negative for abdominal pain and vomiting.  Genitourinary:  Positive for dysuria. Negative for flank pain, hematuria and urgency.  Musculoskeletal:  Positive for arthralgias, back pain and myalgias.  Skin:  Negative for rash.  Neurological:  Positive for weakness and headaches. Negative for dizziness.   all other systems are negative except as noted in the HPI and PMH.    Physical Exam Updated Vital Signs BP 124/75   Pulse (!) 108   Temp (!) 102.9 F (39.4 C)   Resp 19   Ht 5\' 9"  (1.753 m)   Wt 70.3 kg   SpO2 100%   BMI 22.89 kg/m   Physical Exam Vitals and nursing note reviewed.  Constitutional:      General: He is  not in acute distress.    Appearance: He is well-developed.  HENT:     Head: Normocephalic and atraumatic.     Mouth/Throat:     Pharynx: No oropharyngeal exudate.  Eyes:     Conjunctiva/sclera: Conjunctivae normal.     Pupils: Pupils are equal, round, and reactive to light.  Neck:     Comments: No meningismus. Cardiovascular:     Rate and Rhythm: Regular rhythm. Tachycardia present.     Heart sounds: Normal heart sounds. No murmur heard. Pulmonary:     Effort: Pulmonary effort is normal. No respiratory distress.     Breath sounds: Normal breath sounds.  Chest:     Chest wall: No tenderness.  Abdominal:     Palpations: Abdomen is soft.     Tenderness: There is no abdominal tenderness. There is no guarding or rebound.  Musculoskeletal:        General: Tenderness present. Normal range of motion.     Cervical back: Normal range of motion and neck supple.     Comments: Paraspinal lumbar tenderness.  5/5 strength in bilateral lower extremities. Ankle plantar and dorsiflexion intact. Great toe extension intact bilaterally. +2 DP and PT pulses. +2 patellar reflexes bilaterally. Normal gait.   Skin:    General: Skin is warm.  Neurological:     Mental Status: He is alert and oriented to person, place, and time.     Cranial Nerves: No cranial nerve deficit.     Motor: No abnormal muscle tone.     Coordination: Coordination normal.     Comments: No ataxia on finger to nose bilaterally. No pronator drift. 5/5 strength throughout. CN 2-12 intact.Equal grip strength. Sensation intact.   Psychiatric:        Behavior: Behavior normal.    ED Results / Procedures / Treatments   Labs (all labs ordered are listed, but only abnormal results are displayed) Labs Reviewed  RESP PANEL BY RT-PCR (FLU A&B, COVID) ARPGX2 - Abnormal; Notable for the following components:      Result Value   SARS Coronavirus 2 by RT PCR POSITIVE (*)    All other components within normal limits  URINALYSIS, ROUTINE  W REFLEX MICROSCOPIC    EKG None  Radiology DG Chest Portable 1 View  Result Date: 03/15/2021 CLINICAL DATA:  Cough, fever EXAM: PORTABLE CHEST 1 VIEW COMPARISON:  08/02/2017 FINDINGS: Heart and mediastinal contours are within normal limits. No focal opacities or effusions. No acute bony abnormality. IMPRESSION: No active disease. Electronically Signed   By: 08/04/2017 M.D.   On: 03/15/2021 00:44    Procedures Procedures  Medications Ordered in ED Medications  acetaminophen (TYLENOL) tablet 1,000 mg (has no administration in time range)  ibuprofen (ADVIL) tablet 800 mg (has no administration in time range)    ED Course  I have reviewed the triage vital signs and the nursing notes.  Pertinent labs & imaging results that were available during my care of the patient were reviewed by me and considered in my medical decision making (see chart for details).    MDM Rules/Calculators/A&P                          1 day of Chills, fever, headache, body aches, back pain, cough, nausea sore throat congestion.  Febrile and tachycardic on arrival.  Chest x-ray is negative.  Urinalysis is negative.  Low suspicion for cord compression, cauda equina, epidural abscess.  COVID testing is positive.  Patient with no hypoxia or increased work of breathing.  Heart rate and fever have improved. No indication for antiviral medications.  Discussed quarantine precautions at home, supportive care, antipyretics, PCP follow-up. Return to the ED with difficulty breathing, intractable nausea and vomiting, chest pain, any other concerns.  Kellin Ray Shake was evaluated in Emergency Department on 03/15/2021 for the symptoms described in the history of present illness. He was evaluated in the context of the global COVID-19 pandemic, which necessitated consideration that the patient might be at risk for infection with the SARS-CoV-2 virus that causes COVID-19. Institutional protocols and algorithms that pertain  to the evaluation of patients at risk for COVID-19 are in a state of rapid change based on information released by regulatory bodies including the CDC and federal and state organizations. These policies and algorithms were followed during the patient's care in the ED.  Final Clinical Impression(s) / ED Diagnoses Final diagnoses:  COVID-19    Rx / DC Orders ED Discharge Orders     None        Stelios Kirby, Jeannett Senior, MD 03/15/21 0430

## 2021-03-19 ENCOUNTER — Encounter (HOSPITAL_COMMUNITY): Payer: Self-pay

## 2021-03-19 ENCOUNTER — Emergency Department (HOSPITAL_COMMUNITY)
Admission: EM | Admit: 2021-03-19 | Discharge: 2021-03-19 | Disposition: A | Discharge status: Home / Self Care | Payer: Self-pay | Attending: Emergency Medicine | Admitting: Emergency Medicine

## 2021-03-19 ENCOUNTER — Emergency Department (HOSPITAL_COMMUNITY): Payer: Self-pay

## 2021-03-19 ENCOUNTER — Other Ambulatory Visit: Payer: Self-pay

## 2021-03-19 DIAGNOSIS — M549 Dorsalgia, unspecified: Secondary | ICD-10-CM | POA: Insufficient documentation

## 2021-03-19 DIAGNOSIS — R1011 Right upper quadrant pain: Secondary | ICD-10-CM | POA: Insufficient documentation

## 2021-03-19 DIAGNOSIS — F17210 Nicotine dependence, cigarettes, uncomplicated: Secondary | ICD-10-CM | POA: Insufficient documentation

## 2021-03-19 DIAGNOSIS — Z28310 Unvaccinated for covid-19: Secondary | ICD-10-CM | POA: Insufficient documentation

## 2021-03-19 DIAGNOSIS — U071 COVID-19: Secondary | ICD-10-CM | POA: Insufficient documentation

## 2021-03-19 LAB — COMPREHENSIVE METABOLIC PANEL
ALT: 18 U/L (ref 0–44)
AST: 24 U/L (ref 15–41)
Albumin: 4.1 g/dL (ref 3.5–5.0)
Alkaline Phosphatase: 56 U/L (ref 38–126)
Anion gap: 7 (ref 5–15)
BUN: 12 mg/dL (ref 6–20)
CO2: 27 mmol/L (ref 22–32)
Calcium: 8.7 mg/dL — ABNORMAL LOW (ref 8.9–10.3)
Chloride: 103 mmol/L (ref 98–111)
Creatinine, Ser: 0.94 mg/dL (ref 0.61–1.24)
GFR, Estimated: >60 mL/min (ref >60)
Glucose, Bld: 136 mg/dL — ABNORMAL HIGH (ref 70–99)
Potassium: 4 mmol/L (ref 3.5–5.1)
Sodium: 137 mmol/L (ref 135–145)
Total Bilirubin: 0.6 mg/dL (ref 0.3–1.2)
Total Protein: 6.9 g/dL (ref 6.5–8.1)

## 2021-03-19 LAB — URINALYSIS, ROUTINE W REFLEX MICROSCOPIC
Bilirubin Urine: NEGATIVE
Glucose, UA: NEGATIVE mg/dL
Hgb urine dipstick: NEGATIVE
Ketones, ur: NEGATIVE mg/dL
Leukocytes,Ua: NEGATIVE
Nitrite: NEGATIVE
Protein, ur: NEGATIVE mg/dL
Specific Gravity, Urine: >1.03 — ABNORMAL HIGH (ref 1.005–1.030)
pH: 5.5 (ref 5.0–8.0)

## 2021-03-19 LAB — CBC
HCT: 51.9 % (ref 39.0–52.0)
Hemoglobin: 17.9 g/dL — ABNORMAL HIGH (ref 13.0–17.0)
MCH: 31.5 pg (ref 26.0–34.0)
MCHC: 34.5 g/dL (ref 30.0–36.0)
MCV: 91.4 fL (ref 80.0–100.0)
Platelets: 141 10*3/uL — ABNORMAL LOW (ref 150–400)
RBC: 5.68 MIL/uL (ref 4.22–5.81)
RDW: 12.3 % (ref 11.5–15.5)
WBC: 5.9 10*3/uL (ref 4.0–10.5)
nRBC: 0 % (ref 0.0–0.2)

## 2021-03-19 LAB — LIPASE, BLOOD: Lipase: 29 U/L (ref 11–51)

## 2021-03-19 MED ORDER — MORPHINE SULFATE (PF) 4 MG/ML IV SOLN
4.0000 mg | Freq: Once | INTRAVENOUS | Status: AC
  Administered 2021-03-19: 4 mg via INTRAVENOUS
  Filled 2021-03-19: qty 1

## 2021-03-19 MED ORDER — ACETAMINOPHEN 325 MG PO TABS
650.0000 mg | ORAL_TABLET | Freq: Once | ORAL | Status: AC
  Administered 2021-03-19: 650 mg via ORAL
  Filled 2021-03-19: qty 2

## 2021-03-19 MED ORDER — SODIUM CHLORIDE 0.9 % IV BOLUS
1000.0000 mL | Freq: Once | INTRAVENOUS | Status: AC
  Administered 2021-03-19: 1000 mL via INTRAVENOUS

## 2021-03-19 MED ORDER — IOHEXOL 350 MG/ML SOLN
80.0000 mL | Freq: Once | INTRAVENOUS | Status: AC | PRN
  Administered 2021-03-19: 80 mL via INTRAVENOUS

## 2021-03-19 NOTE — ED Triage Notes (Signed)
Pt to er, pt states that he is here because Friday he tested positive for covid here, states that he is back today because he is having back pain and abd pain, and also has some blisters on his lip.  Resps even and unlabored

## 2021-03-19 NOTE — Discharge Instructions (Addendum)
Please continue to monitor your symptoms closely.  If you develop any new or worsening symptoms please come back to the emergency department.  It was a pleasure to meet you. 

## 2021-03-20 NOTE — ED Provider Notes (Signed)
[facility] EMERGENCY DEPARTMENT Provider Note   CSN: [id number] Arrival date & time: 03/19/21  1505     History Chief Complaint  Patient presents with   Abdominal Pain   Back Pain    Ricardo Juarez is a 35 y.o. male.  HPI  Patient is a 35 year old male who presents to the emergency department due to abdominal pain.  Patient states that he was diagnosed with COVID-19 5 days ago.  He has been experiencing progressive cough, fevers, as well as fatigue.  States the cough is dry.  He also began experiencing intermittent abdominal and back pain.  Reports associated nausea as well as constipation.  No diarrhea or vomiting.  No chest pain or shortness of breath.  States he has not been vaccinated for COVID-19 and denies any known history of previous COVID-19 infections.     Past Medical History:  Diagnosis Date   Anxiety    Panic attacks   History of hand surgery 2017    Patient Active Problem List   Diagnosis Date Noted   Abrasion, hand with infection 05/03/2014    Past Surgical History:  Procedure Laterality Date   APPENDECTOMY     HAND SURGERY     HERNIA REPAIR     I & D EXTREMITY Left 05/03/2014   Procedure: Irrigation and Debridement Left Hand;  Surgeon: Cammy Copa, MD;  Location: [facility] OR;  Service: Orthopedics;  Laterality: Left;   ROOT CANAL         History reviewed. No pertinent family history.  Social History   Tobacco Use   Smoking status: Every Day    Packs/day: 1.50    Types: Cigarettes   Smokeless tobacco: Never  Vaping Use   Vaping Use: Never used  Substance Use Topics   Alcohol use: Yes    Comment: occ   Drug use: No    Home Medications Prior to Admission medications   Medication Sig Start Date End Date Taking? Authorizing Provider  albuterol (PROVENTIL HFA;VENTOLIN HFA) 108 (90 Base) MCG/ACT inhaler Inhale 1-2 puffs into the lungs every 6 (six) hours as needed for wheezing or shortness of breath. 05/05/16   Elson Areas, PA-C   benzocaine (ANBESOL) 10 % mucosal gel Use as directed 1 application in the mouth or throat as needed for mouth pain.    [provider]  diclofenac (VOLTAREN) 75 MG EC tablet Take 1 tablet (75 mg total) by mouth 2 (two) times daily. 01/15/20   Elson Areas, PA-C  ibuprofen (ADVIL) 200 MG tablet Take 400 mg by mouth every 6 (six) hours as needed for mild pain or moderate pain.    [provider]  ibuprofen (ADVIL) 800 MG tablet Take 800 mg by mouth once as needed for moderate pain.    [provider]  naproxen sodium (ALEVE) 220 MG tablet Take 220 mg by mouth 2 (two) times daily as needed (for pain).    [provider]    Allergies    Amoxicillin and Penicillins  Review of Systems   Review of Systems  All other systems reviewed and are negative. Ten systems reviewed and are negative for acute change, except as noted in the HPI.   Physical Exam Updated Vital Signs BP 106/69   Pulse (!) 53   Temp 98.4 F (36.9 C) (Oral)   Resp 18   Ht 5\' 9"  (1.753 m)   Wt 68 kg   SpO2 96%   BMI 22.15 kg/m  Physical Exam Vitals and nursing note reviewed.  Constitutional:      General: He is not in acute distress.    Appearance: Normal appearance. He is well-developed and normal weight. He is not ill-appearing, toxic-appearing or diaphoretic.  HENT:     Head: Normocephalic and atraumatic.     Right Ear: External ear normal.     Left Ear: External ear normal.     Nose: Nose normal.     Mouth/Throat:     Mouth: Mucous membranes are moist.     Pharynx: Oropharynx is clear. No oropharyngeal exudate or posterior oropharyngeal erythema.  Eyes:     Extraocular Movements: Extraocular movements intact.  Cardiovascular:     Rate and Rhythm: Normal rate and regular rhythm.     Pulses: Normal pulses.     Heart sounds: Normal heart sounds. No murmur heard.   No friction rub. No gallop.  Pulmonary:     Effort: Pulmonary effort is normal. No respiratory distress.      Breath sounds: Normal breath sounds. No stridor. No wheezing, rhonchi or rales.  Abdominal:     General: Abdomen is flat.     Palpations: Abdomen is soft.     Tenderness: There is abdominal tenderness in the right upper quadrant. There is guarding (voluntary).  Musculoskeletal:        General: Normal range of motion.     Cervical back: Normal range of motion and neck supple. No tenderness.  Skin:    General: Skin is warm and dry.  Neurological:     General: No focal deficit present.     Mental Status: He is alert and oriented to person, place, and time.  Psychiatric:        Mood and Affect: Mood normal.        Behavior: Behavior normal.   ED Results / Procedures / Treatments   Labs (all labs ordered are listed, but only abnormal results are displayed) Labs Reviewed  COMPREHENSIVE METABOLIC PANEL - Abnormal; Notable for the following components:      Result Value   Glucose, Bld 136 (*)    Calcium 8.7 (*)    All other components within normal limits  URINALYSIS, ROUTINE W REFLEX MICROSCOPIC - Abnormal; Notable for the following components:   Specific Gravity, Urine >1.030 (*)    All other components within normal limits  CBC - Abnormal; Notable for the following components:   Hemoglobin 17.9 (*)    Platelets 141 (*)    All other components within normal limits  LIPASE, BLOOD    EKG None  Radiology CT ABDOMEN PELVIS W CONTRAST  Result Date: 03/19/2021 CLINICAL DATA:  Abdominal pain, acute, nonlocalized, right upper quadrant abdominal pain. EXAM: CT ABDOMEN AND PELVIS WITH CONTRAST TECHNIQUE: Multidetector CT imaging of the abdomen and pelvis was performed using the standard protocol following bolus administration of intravenous contrast. CONTRAST:  24mL OMNIPAQUE IOHEXOL 350 MG/ML SOLN COMPARISON:  None. FINDINGS: Lower chest: The visualized lung bases are clear. The visualized heart and pericardium are unremarkable. Hepatobiliary: Poorly circumscribed region of subserosal  low-attenuation within the left hepatic lobe represents focal fatty infiltration surrounding a collateral draining vein of the liver communicating between the left portal vein and superior epigastric vein (vein of [place]) best seen on coronal image # 4/5. The liver is otherwise unremarkable. Gallbladder unremarkable. No intra or extrahepatic biliary ductal dilation. Pancreas: Unremarkable Spleen: Unremarkable Adrenals/Urinary Tract: Adrenal glands are unremarkable. Kidneys are normal, without renal calculi, focal lesion, or hydronephrosis. Bladder  is unremarkable. Stomach/Bowel: The stomach, small bowel, and large bowel are unremarkable. Appendectomy has been performed. No free intraperitoneal gas or fluid. Vascular/Lymphatic: No significant vascular findings are present. No enlarged abdominal or pelvic lymph nodes. Reproductive: Prostate is unremarkable. Other: No abdominal wall hernia. 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 No acute bone abnormality. No lytic or blastic bone lesion. IMPRESSION: No acute intra-abdominal pathology identified. No definite radiographic explanation for the patient's reported symptoms. 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 Electronically Signed   By: Helyn Numbers M.D.   On: 03/19/2021 22:06    Procedures Procedures   Medications Ordered in ED Medications  sodium chloride 0.9 % bolus 1,000 mL (0 mLs Intravenous Stopped 03/19/21 2252)  morphine 4 MG/ML injection 4 mg (4 mg Intravenous Given 03/19/21 2113)  acetaminophen (TYLENOL) tablet 650 mg (650 mg Oral Given 03/19/21 2113)  iohexol (OMNIPAQUE) 350 MG/ML injection 80 mL (80 mLs Intravenous Contrast Given 03/19/21 2138)   ED Course  I have reviewed the triage vital signs and the nursing notes.  Pertinent labs & imaging results that were available during my care of the patient were reviewed by me and considered in my medical decision making  (see chart for details).    MDM Rules/Calculators/A&P                          Pt is a 35 y.o. male who presents to the emergency department due to intermittent upper abdominal pain that has been occurring since being diagnosed with COVID-19, 5 days ago.  Labs: CBC with a hemoglobin of 17.9, platelets of 141. CMP with a glucose of 136 and a calcium of 8.7. UA with an elevated specific gravity. Lipase of 29.  Imaging: CT scan of the abdomen and pelvis with contrast shows no acute intra-abdominal pathology.  No definitive radiographic explanation for the patient's reported symptoms.  I, Placido Sou, PA-C, personally reviewed and evaluated these images and lab results as part of my medical decision-making.  Unsure the source of the patient's symptoms today.  CBC without leukocytosis.  CT scan of the abdomen is reassuring.  Doubt intra-abdominal infection.  Calcium of 8.7 but otherwise no electrolyte derangements noted on CMP.  Patient treated with IV fluids, morphine, as well as Tylenol and notes significant relief of his symptoms.  Feel he is stable for discharge at this time and he is agreeable.  Discussed return precautions in length.  Urged adequate hydration as well as continued quarantine for his symptoms related to COVID-19.  His questions were answered and he was amicable at the time of discharge.  Note: Portions of this report may have been transcribed using voice recognition software. Every effort was made to ensure accuracy; however, inadvertent computerized transcription errors may be present.   Final Clinical Impression(s) / ED Diagnoses Final diagnoses:  COVID-19  RUQ abdominal pain   Rx / DC Orders ED Discharge Orders     None        Placido Sou, PA-C 03/20/21 2219    Tanda Rockers A, DO 03/21/21 0009

## 2021-04-16 ENCOUNTER — Ambulatory Visit
Admission: EM | Admit: 2021-04-16 | Discharge: 2021-04-16 | Disposition: A | Discharge status: Home / Self Care | Payer: Self-pay | Attending: Family Medicine | Admitting: Family Medicine

## 2021-04-16 ENCOUNTER — Encounter: Payer: Self-pay | Admitting: Emergency Medicine

## 2021-04-16 ENCOUNTER — Other Ambulatory Visit: Payer: Self-pay

## 2021-04-16 ENCOUNTER — Ambulatory Visit: Payer: Self-pay

## 2021-04-16 DIAGNOSIS — H10212 Acute toxic conjunctivitis, left eye: Secondary | ICD-10-CM

## 2021-04-16 MED ORDER — TOBRAMYCIN 0.3 % OP SOLN: 1.0000 [drp] | Freq: Four times a day (QID) | OPHTHALMIC | 0 refills | Status: DC

## 2021-04-16 NOTE — ED Triage Notes (Signed)
Pt sts draino splashed in his left eye 2 days ago; pt sts some drainage and redness with irritation

## 2021-04-19 NOTE — ED Provider Notes (Signed)
[facility]   [id number] 04/16/21 Arrival Time: 1802  ASSESSMENT & PLAN:  1. Chemical conjunctivitis of left eye     Follow-up Information     Call  Rennis Chris, MD.   Specialty: Ophthalmology Contact information: [address] [place] [address] [phone]         Call  [facility]   Contact information: [address] [place] [address] [phone]                ED if abrupt worsening. Begin: Meds ordered this encounter  Medications   tobramycin (TOBREX) 0.3 % ophthalmic solution    Sig: Place 1 drop into the right eye every 6 (six) hours.    Dispense:  5 mL    Refill:  0   Ophthalmic drops per orders. Warm compress to eye(s). Local eye care discussed.  Reviewed expectations re: course of current medical issues. Questions answered. Outlined signs and symptoms indicating need for more acute intervention. Patient verbalized understanding. After Visit Summary given.   SUBJECTIVE:  Ricardo Juarez is a 35 y.o. male who presents with complaint of L eye discomfort s/p splashing "some chemical" in his eye approx 48 h ago. Has been red/inflamed since. Mild discomfort. No visual changes. Did flush with water afterward. No eye trauma. Does not wear contact lenses.  OBJECTIVE:  Vitals:   04/16/21 1836  BP: 114/71  Pulse: (!) 59  Resp: 18  Temp: 98.2 F (36.8 C)  TempSrc: Oral  SpO2: 97%    General appearance: alert; no distress HEENT: [place]; AT; PERRLA; no restriction of the extraocular movements OS: with mild reported pain; inferior chemosis; without drainage; without corneal opacities; without limbal flush; with mild swelling of lower lid Neck: supple without LAD Lungs: clear to auscultation bilaterally; unlabored respirations Heart: regular rate and rhythm Skin: warm and dry Psychological: alert and cooperative; normal mood and affect   Allergies  Allergen Reactions   Amoxicillin  Anaphylaxis and Swelling    Swelling of throat    Penicillins Anaphylaxis and Swelling    Past Medical History:  Diagnosis Date   Anxiety    Panic attacks   History of hand surgery 2017   Social History   Socioeconomic History   Marital status: Married    Spouse name: Not on file   Number of children: Not on file   Years of education: Not on file   Highest education level: Not on file  Occupational History   Not on file  Tobacco Use   Smoking status: Every Day    Packs/day: 1.50    Types: Cigarettes   Smokeless tobacco: Never  Vaping Use   Vaping Use: Never used  Substance and Sexual Activity   Alcohol use: Yes    Comment: occ   Drug use: No   Sexual activity: Not on file  Other Topics Concern   Not on file  Social History Narrative   Not on file   Social Determinants of Health   Financial Resource Strain: Not on file  Food Insecurity: Not on file  Transportation Needs: Not on file  Physical Activity: Not on file  Stress: Not on file  Social Connections: Not on file  Intimate Partner Violence: Not on file   History reviewed. No pertinent family history. Past Surgical History:  Procedure Laterality Date   APPENDECTOMY     HAND SURGERY     HERNIA REPAIR     I &  D EXTREMITY Left 05/03/2014   Procedure: Irrigation and Debridement Left Hand;  Surgeon: Cammy Copa, MD;  Location: [facility] OR;  Service: Orthopedics;  Laterality: Left;   ROOT CANAL        Mardella Layman, MD 04/19/21 1306

## 2021-12-26 ENCOUNTER — Ambulatory Visit: Payer: Self-pay | Admitting: Family Medicine

## 2021-12-26 DIAGNOSIS — Z720 Tobacco use: Secondary | ICD-10-CM

## 2021-12-26 DIAGNOSIS — D582 Other hemoglobinopathies: Secondary | ICD-10-CM | POA: Diagnosis not present

## 2021-12-26 DIAGNOSIS — E781 Pure hyperglyceridemia: Secondary | ICD-10-CM | POA: Diagnosis not present

## 2021-12-26 MED ORDER — NICOTINE 21 MG/24HR TD PT24: 21.0000 mg | MEDICATED_PATCH | Freq: Every day | TRANSDERMAL | 0 refills | Status: AC

## 2021-12-26 NOTE — Patient Instructions (Signed)
Follow up annually.  Call with concerns.  Work on smoking cessation. Watch diet closely.  Take care  Dr. Adriana Simas

## 2021-12-26 NOTE — Assessment & Plan Note (Signed)
Possibly related to hemoconcentration or possibly related to polycythemia from tobacco abuse.  Patient is going to cut back on his tobacco use.  Will need repeat labs to reassess.

## 2021-12-26 NOTE — Assessment & Plan Note (Signed)
Advised to watch diet closely.  Patient states that he is working on his diet.

## 2021-12-26 NOTE — Progress Notes (Signed)
Subjective:  Patient ID: Ricardo Juarez, male    DOB: Oct 09, 1985  Age: 35 y.o. MRN: [id number]  CC: Chief Complaint  Patient presents with   New Patient (Initial Visit)    No problems or concerns    HPI:  36 year old male presents to establish care.  Patient states that he is doing fairly well.  He is a current smoker.  He has cut back from 2 packs a day to 1 pack a day.  He is interested in quitting.  He would like nicotine patches to help him do so.  Patient has had recent lab work done on 5/30.  He has his blood work with him today.  His blood work is notable for hypertriglyceridemia with triglycerides of 274.  Also, his hemoglobin was elevated at 18.4.  Hematocrit was 54.2.  Possibly related to hemoconcentration but he is also a smoker.   Social Hx   Social History   Socioeconomic History   Marital status: Married    Spouse name: Not on file   Number of children: Not on file   Years of education: Not on file   Highest education level: Not on file  Occupational History   Not on file  Tobacco Use   Smoking status: Every Day    Packs/day: 1.50    Types: Cigarettes   Smokeless tobacco: Never  Vaping Use   Vaping Use: Never used  Substance and Sexual Activity   Alcohol use: Yes    Comment: occ   Drug use: No   Sexual activity: Not on file  Other Topics Concern   Not on file  Social History Narrative   Not on file   Social Determinants of Health   Financial Resource Strain: Not on file  Food Insecurity: Not on file  Transportation Needs: Not on file  Physical Activity: Not on file  Stress: Not on file  Social Connections: Not on file    Review of Systems  Constitutional: Negative.   Cardiovascular: Negative.    Objective:  BP 112/78   Pulse 62   Temp 98 F (36.7 C) (Oral)   Ht 5\' 8"  (1.727 m)   Wt 140 lb (63.5 kg)   SpO2 97%   BMI 21.29 kg/m      12/26/2021   10:04 AM 04/16/2021    6:36 PM 03/19/2021   11:00 PM  BP/Weight  Systolic BP 112  05/19/2021 106  Diastolic BP 78 71 69  Wt. (Lbs) 140    BMI 21.29 kg/m2      Physical Exam Vitals and nursing note reviewed.  Constitutional:      General: He is not in acute distress.    Appearance: Normal appearance.  HENT:     Head: Normocephalic and atraumatic.     Right Ear: Tympanic membrane normal.     Left Ear: Tympanic membrane normal.     Mouth/Throat:     Pharynx: Oropharynx is clear.  Eyes:     General:        Right eye: No discharge.        Left eye: No discharge.     Conjunctiva/sclera: Conjunctivae normal.  Cardiovascular:     Rate and Rhythm: Normal rate and regular rhythm.     Heart sounds: No murmur heard. Pulmonary:     Effort: Pulmonary effort is normal.     Breath sounds: Normal breath sounds. No wheezing or rales.  Abdominal:     General: There is no distension.  Palpations: Abdomen is soft.     Tenderness: There is no abdominal tenderness.  Neurological:     Mental Status: He is alert.  Psychiatric:        Mood and Affect: Mood normal.        Behavior: Behavior normal.     Lab Results  Component Value Date   WBC 5.9 03/19/2021   HGB 17.9 (H) 03/19/2021   HCT 51.9 03/19/2021   PLT 141 (L) 03/19/2021   GLUCOSE 136 (H) 03/19/2021   ALT 18 03/19/2021   AST 24 03/19/2021   NA 137 03/19/2021   K 4.0 03/19/2021   CL 103 03/19/2021   CREATININE 0.94 03/19/2021   BUN 12 03/19/2021   CO2 27 03/19/2021     Assessment & Plan:   Problem List Items Addressed This Visit       Other   Elevated hemoglobin (HCC)    Possibly related to hemoconcentration or possibly related to polycythemia from tobacco abuse.  Patient is going to cut back on his tobacco use.  Will need repeat labs to reassess.      Hypertriglyceridemia    Advised to watch diet closely.  Patient states that he is working on his diet.      Tobacco abuse    Treating with nicotine patches.       Meds ordered this encounter  Medications   nicotine (NICODERM CQ - DOSED IN MG/24  HOURS) 21 mg/24hr patch    Sig: Place 1 patch (21 mg total) onto the skin daily.    Dispense:  28 patch    Refill:  0    Follow-up:  Return in about 1 year (around 12/27/2022).  Everlene Other DO [facility]

## 2021-12-26 NOTE — Assessment & Plan Note (Signed)
Treating with nicotine patches.

## 2022-03-22 ENCOUNTER — Emergency Department (HOSPITAL_COMMUNITY)
Admission: EM | Admit: 2022-03-22 | Discharge: 2022-03-22 | Disposition: A | Discharge status: Home / Self Care | Payer: BLUE CROSS/BLUE SHIELD | Attending: Emergency Medicine | Admitting: Emergency Medicine

## 2022-03-22 ENCOUNTER — Encounter (HOSPITAL_COMMUNITY): Payer: Self-pay

## 2022-03-22 ENCOUNTER — Other Ambulatory Visit: Payer: Self-pay

## 2022-03-22 DIAGNOSIS — M5441 Lumbago with sciatica, right side: Secondary | ICD-10-CM | POA: Diagnosis not present

## 2022-03-22 DIAGNOSIS — M5431 Sciatica, right side: Secondary | ICD-10-CM

## 2022-03-22 DIAGNOSIS — G8929 Other chronic pain: Secondary | ICD-10-CM | POA: Diagnosis not present

## 2022-03-22 MED ORDER — DEXAMETHASONE SODIUM PHOSPHATE 10 MG/ML IJ SOLN
10.0000 mg | Freq: Once | INTRAMUSCULAR | Status: AC
  Administered 2022-03-22: 10 mg via INTRAMUSCULAR
  Filled 2022-03-22: qty 1

## 2022-03-22 MED ORDER — CYCLOBENZAPRINE HCL 5 MG PO TABS: 5.0000 mg | ORAL_TABLET | Freq: Three times a day (TID) | ORAL | 0 refills | Status: AC | PRN

## 2022-03-22 MED ORDER — PREDNISONE 10 MG PO TABS: ORAL_TABLET | ORAL | 0 refills | Status: AC

## 2022-03-22 NOTE — ED Triage Notes (Signed)
Pt presents to ED with complaints of lower back pain radiating down right leg x 2 days. 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

## 2022-03-22 NOTE — ED Provider Notes (Signed)
[facility] EMERGENCY DEPARTMENT Provider Note   CSN: [id number] Arrival date & time: 03/22/22  1106     History  Chief Complaint  Patient presents with   Back Pain    Ricardo Juarez is a 36 y.o. male  presents with acute on chronic low back pain which has which has been present for the past 3 days.   Patient was helping to lift a heavy tote at work last week and 2 days ago noticed increasing midline lumbar pain which radiates down his right leg and is consistent with prior episodes of sciatica.  T  There has been no weakness or numbness in the lower extremities and no urinary or bowel retention or incontinence.  Patient does not have a history of cancer or IVDU.  He has found no alleviators for symptoms.  He does state he has chronic daily pain localizing to his lower back for years, it generally gets better when he gets out of bed and starts moving around but has initial stiffness in the lower spine early in the morning.  It has been several years since he has had a flare of his sciatica.  The history is provided by the patient.       Home Medications Prior to Admission medications   Medication Sig Start Date End Date Taking? Authorizing Provider  cyclobenzaprine (FLEXERIL) 5 MG tablet Take 1 tablet (5 mg total) by mouth 3 (three) times daily as needed for muscle spasms. 03/22/22  Yes Keyosha Tiedt, Raynelle Fanning, PA-C  predniSONE (DELTASONE) 10 MG tablet 6, 5, 4, 3, 2 then 1 tablet by mouth daily for 6 days total. 03/22/22  Yes Apollo Timothy, Raynelle Fanning, PA-C  albuterol (VENTOLIN HFA) 108 (90 Base) MCG/ACT inhaler Inhale 2 puffs into the lungs every 6 (six) hours as needed for wheezing or shortness of breath.    [provider]  Cholecalciferol (VITAMIN D-3 PO) Take 2,000 Int'l Units/day by mouth daily.    [provider]  Cyanocobalamin (B-12) 2500 MCG TABS Take 2,500 mg by mouth daily.    [provider]  diphenhydrAMINE HCl (ALLERGY MED PO) Take 25 mg by mouth daily. 1 tablet once a  day as needed    [provider]  naproxen sodium (ALEVE) 220 MG tablet Take 220 mg by mouth 2 (two) times daily as needed (for pain).    [provider]  nicotine (NICODERM CQ - DOSED IN MG/24 HOURS) 21 mg/24hr patch Place 1 patch (21 mg total) onto the skin daily. 12/26/21   Tommie Sams, DO  Omega-3 Fatty Acids (FISH OIL) 1200 MG CAPS Take 1,200 mg by mouth daily.    [provider]      Allergies    Amoxicillin and Penicillins    Review of Systems   Review of Systems  Constitutional:  Negative for fever.  Respiratory:  Negative for shortness of breath.   Cardiovascular:  Negative for chest pain and leg swelling.  Gastrointestinal:  Negative for abdominal distention, abdominal pain and constipation.  Genitourinary:  Negative for difficulty urinating, dysuria, flank pain, frequency and urgency.  Musculoskeletal:  Positive for back pain. Negative for gait problem and joint swelling.  Skin:  Negative for rash.  Neurological:  Negative for weakness and numbness.  All other systems reviewed and are negative.   Physical Exam Updated Vital Signs BP 114/79 (BP Location: Right Arm)   Pulse 69   Temp 98 F (36.7 C) (Oral)   Resp 18   Ht 5\' 9"  (1.753  m)   Wt 65.8 kg   SpO2 98%   BMI 21.41 kg/m  Physical Exam Vitals and nursing note reviewed.  Constitutional:      Appearance: He is well-developed.  HENT:     Head: Normocephalic.  Eyes:     Conjunctiva/sclera: Conjunctivae normal.  Cardiovascular:     Rate and Rhythm: Normal rate.     Pulses: Normal pulses.     Comments: Pedal pulses normal. Pulmonary:     Effort: Pulmonary effort is normal.  Abdominal:     General: Bowel sounds are normal. There is no distension.     Palpations: Abdomen is soft. There is no mass.  Musculoskeletal:        General: Normal range of motion.     Cervical back: Normal range of motion and neck supple.     Lumbar back: Tenderness present. No swelling, edema or spasms.  Negative right straight leg raise test and negative left straight leg raise test.     Comments: Midline to right paralumbar tenderness.    Skin:    General: Skin is warm and dry.  Neurological:     Mental Status: He is alert.     Sensory: No sensory deficit.     Motor: No tremor or atrophy.     Gait: Gait normal.     Deep Tendon Reflexes:     Reflex Scores:      Patellar reflexes are 2+ on the right side and 2+ on the left side.    Comments: No strength deficit noted in hip and knee flexor and extensor muscle groups.  Ankle flexion and extension intact.     ED Results / Procedures / Treatments   Labs (all labs ordered are listed, but only abnormal results are displayed) Labs Reviewed - No data to display  EKG None  Radiology No results found.  Procedures Procedures    Medications Ordered in ED Medications  dexamethasone (DECADRON) injection 10 mg (has no administration in time range)    ED Course/ Medical Decision Making/ A&P                           Medical Decision Making No neuro deficit on exam or by history to suggest emergent or surgical presentation.  Also discussed worsened sx that should prompt immediate re-evaluation including distal weakness, bowel/bladder retention/incontinence.        Risk Prescription drug management.           Final Clinical Impression(s) / ED Diagnoses Final diagnoses:  Sciatica of right side    Rx / DC Orders ED Discharge Orders          Ordered    predniSONE (DELTASONE) 10 MG tablet        03/22/22 1248    cyclobenzaprine (FLEXERIL) 5 MG tablet  3 times daily PRN        03/22/22 1248              Burgess Amor, PA-C 03/22/22 1250    Derwood Kaplan, MD 03/24/22 1223

## 2022-03-22 NOTE — Discharge Instructions (Addendum)
Take your next dose of prednisone tomorrow morning.  Use the the other medicines as directed.  You may add the Flexeril which is a muscle relaxer if you choose, this may also help with your symptoms.  Avoid lifting,  Bending,  Twisting or any other activity that worsens your pain over the next week.  Apply an  icepack  to your lower back for 10-15 minutes every 2 hours for the next 2 days.  You should get rechecked if your symptoms are not better over the next 5 days,  Or you develop increased pain,  Weakness in your leg(s) or loss of bladder or bowel function - these are potential symptoms of a worsening condition.

## 2022-04-22 IMAGING — CT CT ABD-PELV W/ CM
2 of 4 series · 16 of 46 positions shown, 18 images · IV contrast (omnipaque)
Comparison: None.

CLINICAL DATA: Abdominal pain, acute, nonlocalized, right upper
quadrant abdominal pain.

EXAM:
CT ABDOMEN AND PELVIS WITH CONTRAST
TECHNIQUE: Multidetector CT imaging of the abdomen and pelvis was performed
using the standard protocol following bolus administration of
intravenous contrast.
CONTRAST:  80mL OMNIPAQUE IOHEXOL 350 MG/ML SOLN

[Series 2: axial st · axial · 0.60mm/px · z∈[-421,-11]mm · 13 of 90 slices shown, 15 images]
[im 4/90  soft-tissue]
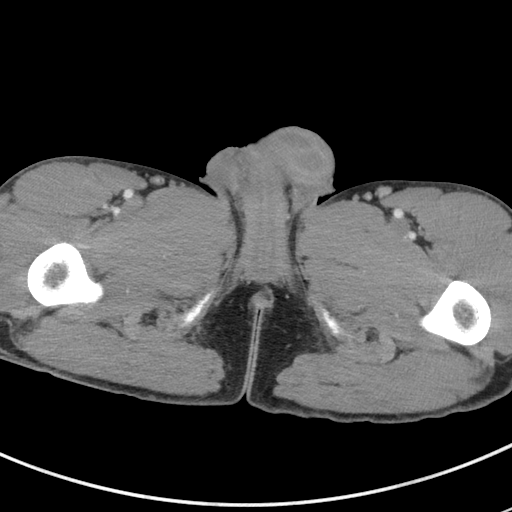
[im 4/90  bone]
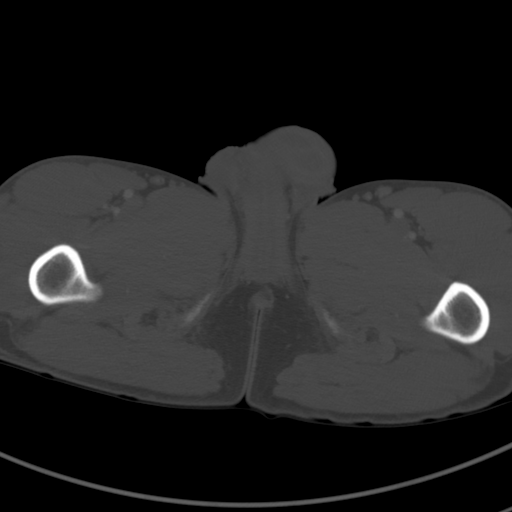
[im 12/90  soft-tissue]
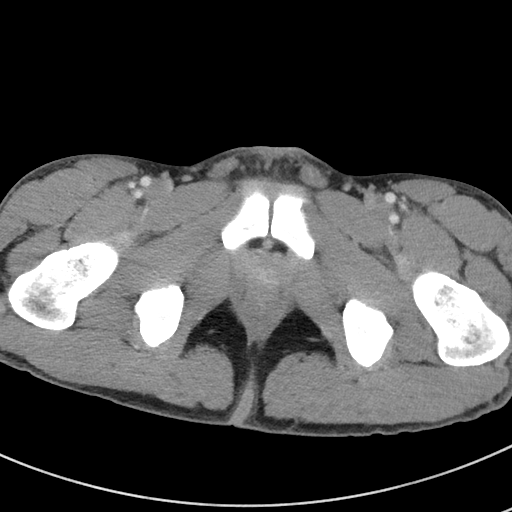
[im 20/90  soft-tissue]
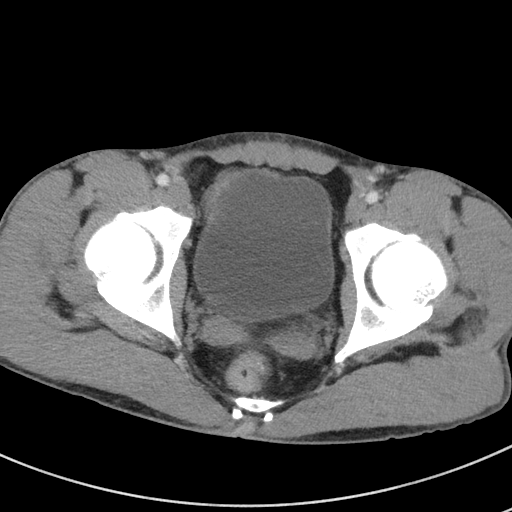
[im 24/90  soft-tissue]
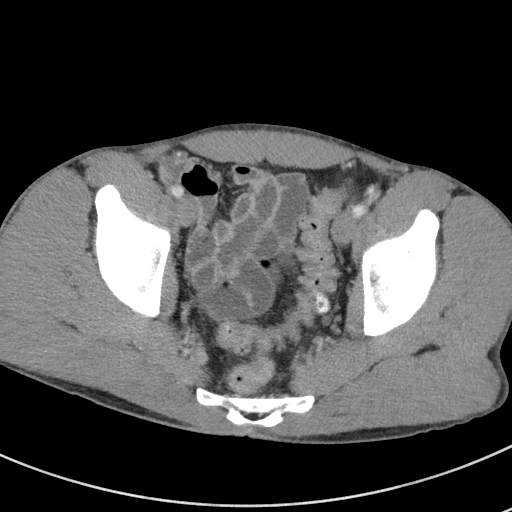
[im 31/90  soft-tissue]
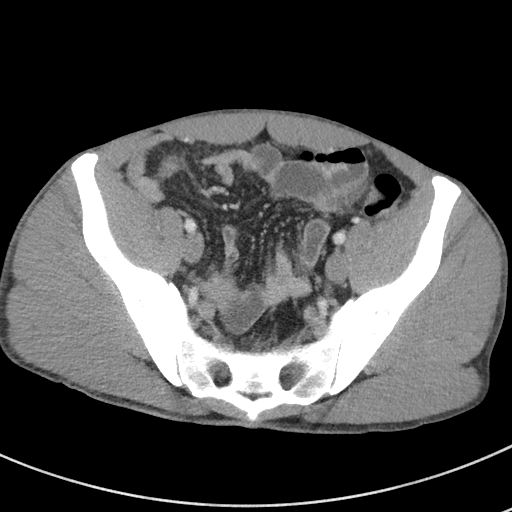
[im 39/90  soft-tissue]
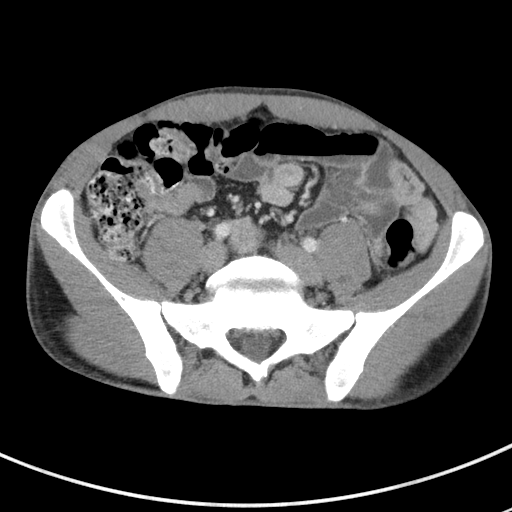
[im 47/90  soft-tissue]
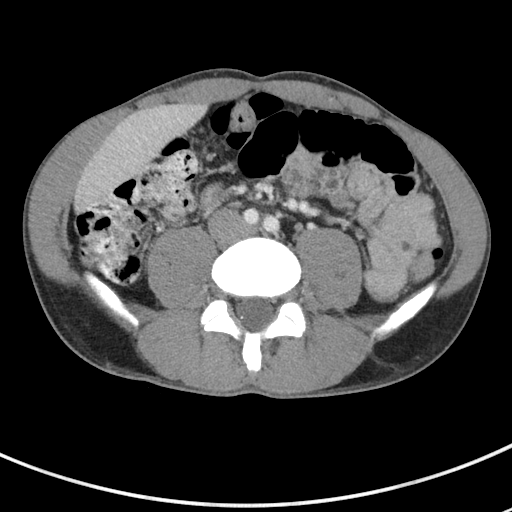
[im 51/90  soft-tissue]
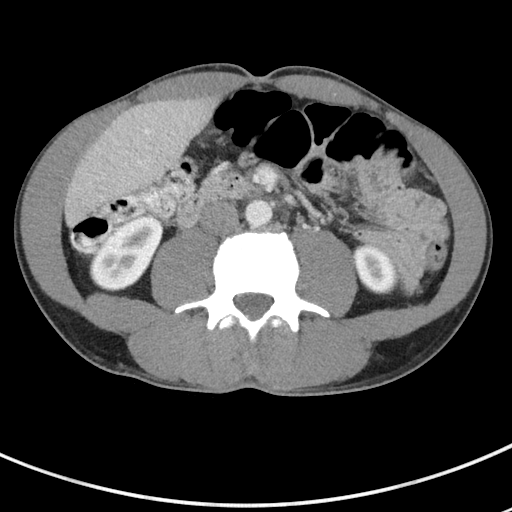
[im 59/90  soft-tissue]
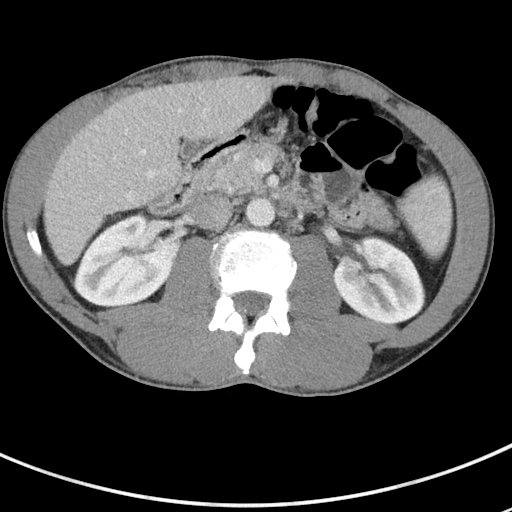
[im 59/90  bone]
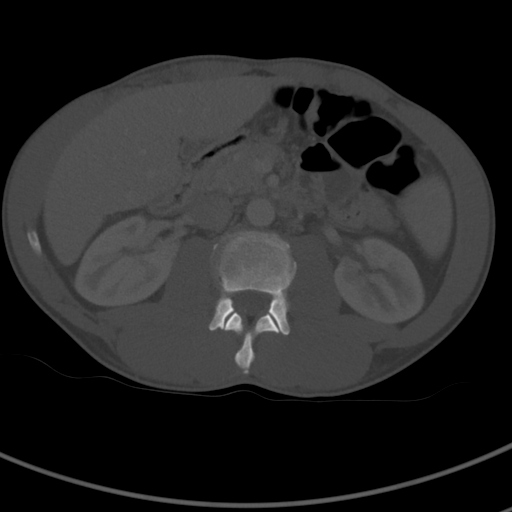
[im 66/90  soft-tissue]
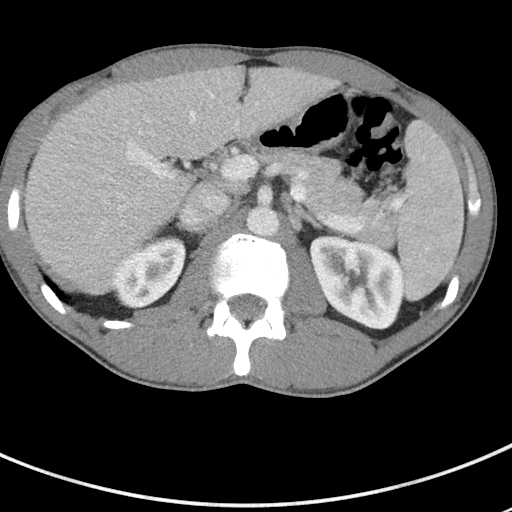
[im 70/90  soft-tissue]
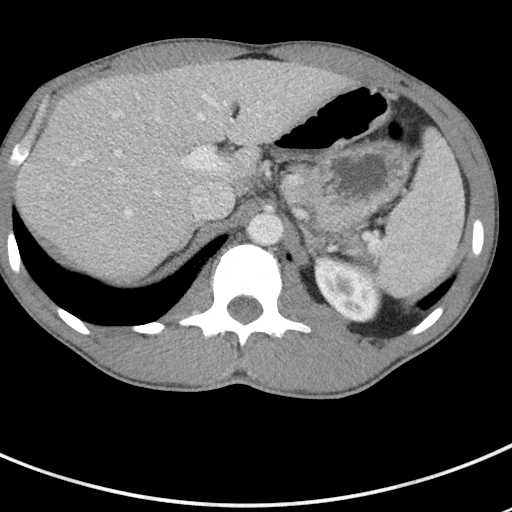
[im 78/90  soft-tissue]
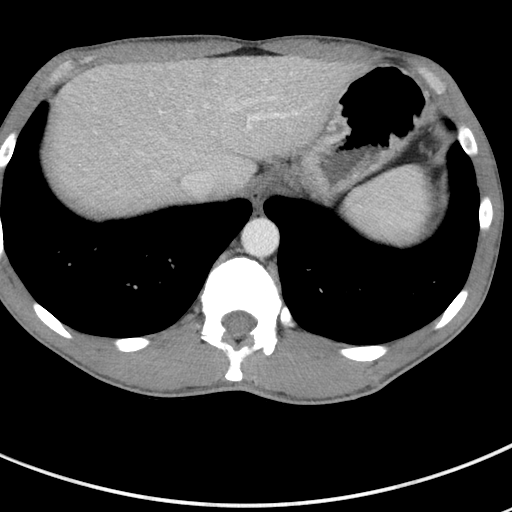
[im 86/90  soft-tissue]
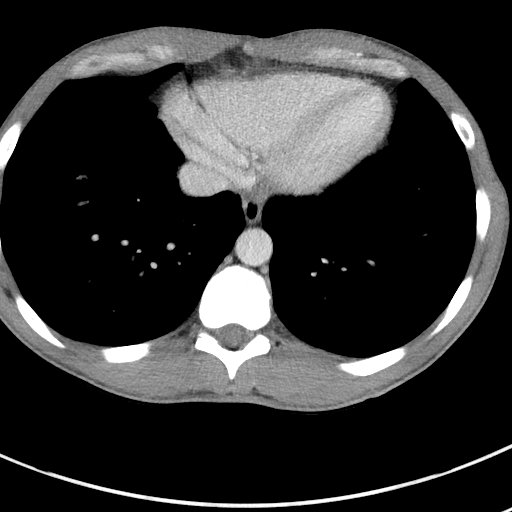

[Series 5: coronal st · coronal · 0.78mm/px · 3 of 70 slices shown]
[im 24/70  soft-tissue]
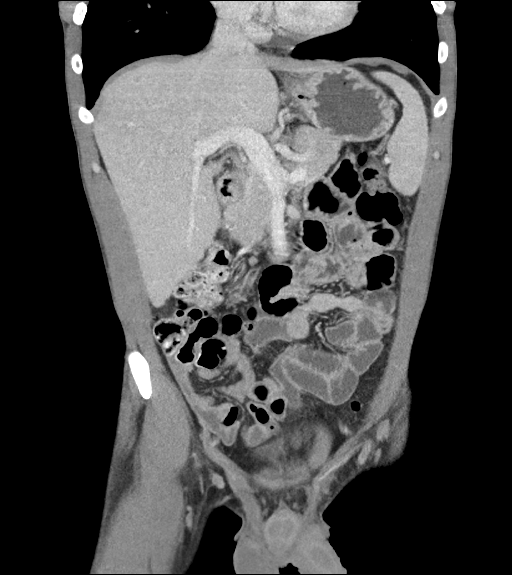
[im 31/70  soft-tissue]
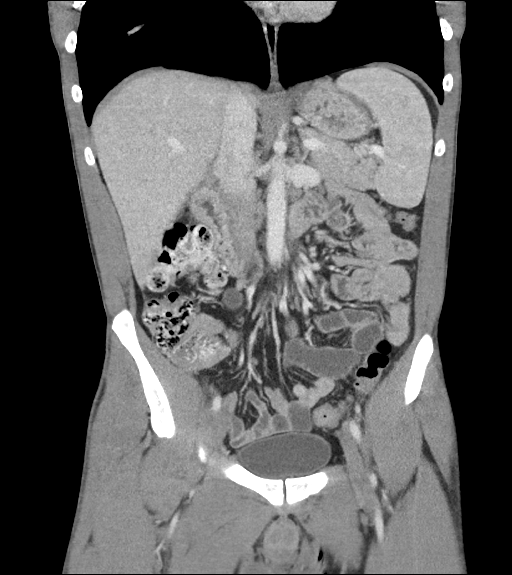
[im 39/70  soft-tissue]
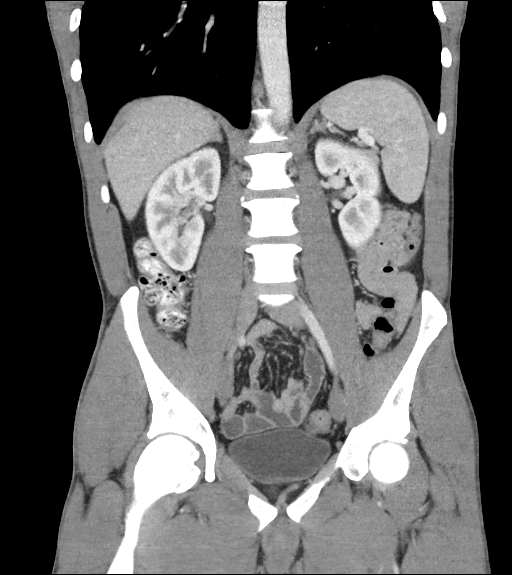

[16 of 46 positions shown; findings below may reference images not displayed]

FINDINGS: Lower chest: The visualized lung bases are clear. The visualized
heart and pericardium are unremarkable.

Hepatobiliary: Poorly circumscribed region of subserosal
low-attenuation within the left hepatic lobe represents focal fatty
infiltration surrounding a collateral draining vein of the liver
communicating between the left portal vein and superior epigastric
vein (vein of [place]) best seen on coronal image # [DATE]. The liver is
otherwise unremarkable. Gallbladder unremarkable. No intra or
extrahepatic biliary ductal dilation.

Pancreas: Unremarkable

Spleen: Unremarkable

Adrenals/Urinary Tract: Adrenal glands are unremarkable. Kidneys are
normal, without renal calculi, focal lesion, or hydronephrosis.
Bladder is unremarkable.

Stomach/Bowel: The stomach, small bowel, and large bowel are
unremarkable. Appendectomy has been performed. No free
intraperitoneal gas or fluid.

Vascular/Lymphatic: No significant vascular findings are present. No
enlarged abdominal or pelvic lymph nodes.

Reproductive: Prostate is unremarkable.

Other: No abdominal wall hernia.

Musculoskeletal: Bilateral L5 pars defects are present without
associated spondylolisthesis. No acute bone abnormality. No lytic or
blastic bone lesion.
IMPRESSION: No acute intra-abdominal pathology identified. No definite
radiographic explanation for the patient's reported symptoms.

Hepatic pseudo lesion related to variant venous drainage of the
liver.

## 2023-02-22 ENCOUNTER — Ambulatory Visit: Payer: BLUE CROSS/BLUE SHIELD | Admitting: Family Medicine

## 2024-02-18 ENCOUNTER — Ambulatory Visit: Payer: Self-pay

## 2024-02-18 NOTE — Telephone Encounter (Signed)
 FYI Only or Action Required?: FYI only for provider.  Patient was last seen in primary care on 12/26/2021 by Cook, Jayce G, DO.  Called Nurse Triage reporting No chief complaint on file..  Symptoms began several days ago.  Interventions attempted: Rest, hydration, or home remedies.  Symptoms are: unchanged.  Triage Disposition: See PCP When Office is Open (Within 3 Days)  Patient/caregiver understands and will follow disposition?: Yes Reason for Disposition  [1] MODERATE pain (e.g., interferes with normal activities) AND [2] present > 3 days  Answer Assessment - Initial Assessment Questions Right upper arm pain and tingling x 3 days, only present when stretching. ED precautions reviewed, pt verbalized understanding.    1. ONSET: When did the pain start?     3 days  2. LOCATION: Where is the pain located?     Right arm  3. PAIN: How bad is the pain? (Scale 0-10; or none, mild, moderate, severe)     7/10 when stretching, 0/10 when at rest  4. WORK OR EXERCISE: Has there been any recent work or exercise that involved this part of the body?     Works a physically demanding job  5. CAUSE: What do you think is causing the arm pain?     Unknown  Protocols used: Arm Pain-A-AH Copied from CRM [id number]. Topic: Appointments - Appointment Scheduling >> Feb 18, 2024 10:11 AM Tonda B wrote: Patient/patient representative is calling to schedule an appointment. Refer to attachments for appointment information.  Pt has numbness in right arm

## 2024-02-21 ENCOUNTER — Ambulatory Visit: Admitting: Physician Assistant

## 2024-02-22 LAB — LAB REPORT - SCANNED: EGFR: 86

## 2024-03-04 ENCOUNTER — Other Ambulatory Visit: Payer: Self-pay

## 2024-03-04 ENCOUNTER — Encounter (HOSPITAL_COMMUNITY): Payer: Self-pay | Admitting: Emergency Medicine

## 2024-03-04 ENCOUNTER — Emergency Department (HOSPITAL_COMMUNITY)
Admission: EM | Admit: 2024-03-04 | Discharge: 2024-03-04 | Disposition: A | Discharge status: Home / Self Care | Attending: Student | Admitting: Student

## 2024-03-04 DIAGNOSIS — L723 Sebaceous cyst: Secondary | ICD-10-CM | POA: Insufficient documentation

## 2024-03-04 DIAGNOSIS — I809 Phlebitis and thrombophlebitis of unspecified site: Secondary | ICD-10-CM | POA: Insufficient documentation

## 2024-03-04 DIAGNOSIS — F17210 Nicotine dependence, cigarettes, uncomplicated: Secondary | ICD-10-CM | POA: Diagnosis not present

## 2024-03-04 MED ORDER — NAPROXEN 375 MG PO TABS: 375.0000 mg | ORAL_TABLET | Freq: Two times a day (BID) | ORAL | 0 refills | Status: DC

## 2024-03-04 MED ORDER — NAPROXEN 250 MG PO TABS
500.0000 mg | ORAL_TABLET | Freq: Once | ORAL | Status: AC
  Administered 2024-03-04: 500 mg via ORAL
  Filled 2024-03-04: qty 2

## 2024-03-04 MED ORDER — NAPROXEN 375 MG PO TABS: 375.0000 mg | ORAL_TABLET | Freq: Two times a day (BID) | ORAL | 0 refills | Status: AC

## 2024-03-04 NOTE — ED Triage Notes (Signed)
 Pt with unknown nodules to right bicep and near the axilla.  Pt reports they hurt with movement to his arm but otherwise not painful.

## 2024-03-05 NOTE — ED Provider Notes (Signed)
 [place] EMERGENCY DEPARTMENT AT [facility] Provider Note  CSN: [id number] Arrival date & time: 03/04/24 1933  Chief Complaint(s) Abscess  HPI Ricardo Juarez is a 38 y.o. male with PMH panic attacks, hypertriglyceridemia who presents emerged part for evaluation of an arm nodule.  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  He was feeling around in his bicep and felt a round mobile mass near the axilla on the right.  Also feels a nodular density over the basilic vein. Denies numbness, tingling, weakness of the upper extremity.  Denies blunt trauma to the arm.   Past Medical History Past Medical History:  Diagnosis Date   Anxiety    Panic attacks   History of hand surgery 2017   Patient Active Problem List   Diagnosis Date Noted   Tobacco abuse 12/26/2021   Elevated hemoglobin (HCC) 12/26/2021   Hypertriglyceridemia 12/26/2021   Home Medication(s) Prior to Admission medications   Medication Sig Start Date End Date Taking? Authorizing Provider  albuterol  (VENTOLIN  HFA) 108 (90 Base) MCG/ACT inhaler Inhale 2 puffs into the lungs every 6 (six) hours as needed for wheezing or shortness of breath.    [provider]  Cholecalciferol (VITAMIN D-3 PO) Take 2,000 Int'l Units/day by mouth daily.    [provider]  Cyanocobalamin (B-12) 2500 MCG TABS Take 2,500 mg by mouth daily.    [provider]  cyclobenzaprine  (FLEXERIL ) 5 MG tablet Take 1 tablet (5 mg total) by mouth 3 (three) times daily as needed for muscle spasms. 03/22/22   Idol, Julie, PA-C  diphenhydrAMINE HCl (ALLERGY MED PO) Take 25 mg by mouth daily. 1 tablet once a day as needed    [provider]  naproxen  (NAPROSYN ) 375 MG tablet Take 1 tablet (375 mg total) by mouth 2 (two) times daily. 03/04/24   Arhan Mcmanamon, MD  naproxen  sodium (ALEVE ) 220 MG tablet Take 220 mg by mouth 2 (two) times daily as needed (for pain).     [provider]  nicotine  (NICODERM CQ  - DOSED IN MG/24 HOURS) 21 mg/24hr patch Place 1 patch (21 mg total) onto the skin daily. 12/26/21   Cook, Jayce G, DO  Omega-3 Fatty Acids (FISH OIL) 1200 MG CAPS Take 1,200 mg by mouth daily.    [provider]  predniSONE  (DELTASONE ) 10 MG tablet 6, 5, 4, 3, 2 then 1 tablet by mouth daily for 6 days total. 03/22/22   Birdena Mliss RIGGERS                                                                                                                                    Past Surgical History Past Surgical History:  Procedure Laterality Date   APPENDECTOMY     HAND SURGERY     HERNIA REPAIR     I & D EXTREMITY Left 05/03/2014  Procedure: Irrigation and Debridement Left Hand;  Surgeon: Cordella Glendia Hutchinson, MD;  Location: [facility] OR;  Service: Orthopedics;  Laterality: Left;   ROOT CANAL     Family History History reviewed. No pertinent family history.  Social History Social History   Tobacco Use   Smoking status: Every Day    Current packs/day: 1.00    Types: Cigarettes   Smokeless tobacco: Never  Vaping Use   Vaping status: Never Used  Substance Use Topics   Alcohol use: Yes    Comment: occ   Drug use: No   Allergies Amoxicillin and Penicillins  Review of Systems Review of Systems  All other systems reviewed and are negative.   Physical Exam Vital Signs  I have reviewed the triage vital signs BP 121/84 (BP Location: Right Arm)   Pulse 64   Temp 98.2 F (36.8 C) (Oral)   Resp 20   SpO2 98%   Physical Exam Constitutional:      General: He is not in acute distress.    Appearance: Normal appearance.  HENT:     Head: Normocephalic and atraumatic.     Nose: No congestion or rhinorrhea.  Eyes:     General:        Right eye: No discharge.        Left eye: No discharge.     Extraocular Movements: Extraocular movements intact.     Pupils: Pupils are equal, round, and reactive to light.  Cardiovascular:     Rate and  Rhythm: Normal rate and regular rhythm.     Heart sounds: No murmur heard. Pulmonary:     Effort: No respiratory distress.     Breath sounds: No wheezing or rales.  Abdominal:     General: There is no distension.     Tenderness: There is no abdominal tenderness.  Musculoskeletal:        General: Normal range of motion.     Cervical back: Normal range of motion.  Skin:    General: Skin is warm and dry.     Findings: Lesion present.  Neurological:     General: No focal deficit present.     Mental Status: He is alert.     ED Results and Treatments Labs (all labs ordered are listed, but only abnormal results are displayed) Labs Reviewed - No data to display                                                                                                                        Radiology No results found.  Pertinent labs & imaging results that were available during my care of the patient were reviewed by me and considered in my medical decision making (see MDM for details).  Medications Ordered in ED Medications  naproxen  (NAPROSYN ) tablet 500 mg (500 mg Oral Given 03/04/24 2026)  Procedures Procedures  (including critical care time)  Medical Decision Making / ED Course   This patient presents to the ED for concern of arm nodule, this involves an extensive number of treatment options, and is a complaint that carries with it a high risk of complications and morbidity.  The differential diagnosis includes sebaceous cyst, lymphadenopathy, thrombophlebitis, upper extremity DVT, abscess  MDM: Patient seen emerged part for evaluation of arm pain with a palpable arm lesion.  Physical exam with a 1 cm mobile soft lesion in the proximal arm near the axilla on the right as well as some smaller nodular densities along the basilic vein on the right.  Bedside  ultrasound obtained that shows a circumferential fluid-filled collection near the axilla consistent with a sebaceous cyst.  There is no surrounding erythema over the basilic vein but as these nodular densities appear to be tracking along this distribution differential includes thrombophlebitis versus calcifications.  Very low suspicion for upper extremity DVT given no erythema, no swelling and no tenderness to palpation directly over these areas.  Will start the patient on anti-inflammatories and he will use warm compresses and heat packs.  Did place an outpatient orthopedic referral.  At this time he does not meet inpatient criteria for admission will be discharged outpatient follow-up.  Return precautions given of which he voiced understanding.   Additional history obtained: -Additional history obtained from wife -External records from outside source obtained and reviewed including: Chart review including previous notes, labs, imaging, consultation notes     Medicines ordered and prescription drug management: Meds ordered this encounter  Medications   naproxen  (NAPROSYN ) tablet 500 mg   DISCONTD: naproxen  (NAPROSYN ) 375 MG tablet    Sig: Take 1 tablet (375 mg total) by mouth 2 (two) times daily.    Dispense:  20 tablet    Refill:  0   naproxen  (NAPROSYN ) 375 MG tablet    Sig: Take 1 tablet (375 mg total) by mouth 2 (two) times daily.    Dispense:  20 tablet    Refill:  0    -I have reviewed the patients home medicines and have made adjustments as needed  Critical interventions none    Social Determinants of Health:  Factors impacting patients care include: none   Reevaluation: After the interventions noted above, I reevaluated the patient and found that they have :improved  Co morbidities that complicate the patient evaluation  Past Medical History:  Diagnosis Date   Anxiety    Panic attacks   History of hand surgery 2017      Dispostion: I considered admission for  this patient, but at this time he does not meet inpatient criteria for admission and will be discharged with outpatient follow-up.     Final Clinical Impression(s) / ED Diagnoses Final diagnoses:  Sebaceous cyst  Thrombophlebitis     @PCDICTATION @    Albertina Dixon, MD 03/05/24 1123

## 2024-03-29 LAB — LAB REPORT - SCANNED: EGFR: 87

## 2024-04-10 ENCOUNTER — Encounter: Payer: Self-pay | Admitting: Nurse Practitioner
